# Patient Record
Sex: Male | Born: 1980 | Race: Black or African American | Hispanic: No | Marital: Single | Smoking: Current every day smoker
Health system: Southern US, Community
[De-identification: ages and names within clinical notes are randomized; demographics above are authoritative.]

## PROBLEM LIST (undated history)

## (undated) DIAGNOSIS — J189 Pneumonia, unspecified organism: Secondary | ICD-10-CM

## (undated) DIAGNOSIS — Z21 Asymptomatic human immunodeficiency virus [HIV] infection status: Secondary | ICD-10-CM

## (undated) DIAGNOSIS — G510 Bell's palsy: Secondary | ICD-10-CM

## (undated) DIAGNOSIS — I10 Essential (primary) hypertension: Secondary | ICD-10-CM

---

## 1999-01-20 ENCOUNTER — Encounter (INDEPENDENT_AMBULATORY_CARE_PROVIDER_SITE_OTHER): Payer: Self-pay | Admitting: *Deleted

## 1999-01-20 LAB — CONVERTED CEMR LAB: CD4 Count: 382 microliters

## 1999-02-01 ENCOUNTER — Encounter (INDEPENDENT_AMBULATORY_CARE_PROVIDER_SITE_OTHER): Payer: Self-pay | Admitting: *Deleted

## 2006-01-02 ENCOUNTER — Encounter (INDEPENDENT_AMBULATORY_CARE_PROVIDER_SITE_OTHER): Payer: Self-pay | Admitting: *Deleted

## 2006-01-02 LAB — CONVERTED CEMR LAB: CD4 Count: 297 microliters

## 2006-01-31 ENCOUNTER — Encounter (INDEPENDENT_AMBULATORY_CARE_PROVIDER_SITE_OTHER): Payer: Self-pay | Admitting: *Deleted

## 2006-05-20 ENCOUNTER — Encounter (INDEPENDENT_AMBULATORY_CARE_PROVIDER_SITE_OTHER): Payer: Self-pay | Admitting: *Deleted

## 2006-05-20 ENCOUNTER — Ambulatory Visit: Payer: Self-pay | Admitting: Internal Medicine

## 2006-05-20 ENCOUNTER — Encounter: Admission: RE | Admit: 2006-05-20 | Discharge: 2006-05-20 | Payer: Self-pay | Admitting: Internal Medicine

## 2006-05-20 LAB — CONVERTED CEMR LAB: HIV 1 RNA Quant: 70 copies/mL

## 2006-07-15 ENCOUNTER — Emergency Department (HOSPITAL_COMMUNITY): Admission: EM | Admit: 2006-07-15 | Discharge: 2006-07-16 | Payer: Self-pay | Admitting: Emergency Medicine

## 2006-09-10 ENCOUNTER — Encounter: Payer: Self-pay | Admitting: Internal Medicine

## 2006-10-27 ENCOUNTER — Encounter (INDEPENDENT_AMBULATORY_CARE_PROVIDER_SITE_OTHER): Payer: Self-pay | Admitting: *Deleted

## 2006-10-27 LAB — CONVERTED CEMR LAB

## 2006-11-09 ENCOUNTER — Encounter (INDEPENDENT_AMBULATORY_CARE_PROVIDER_SITE_OTHER): Payer: Self-pay | Admitting: *Deleted

## 2006-11-13 ENCOUNTER — Emergency Department (HOSPITAL_COMMUNITY): Admission: EM | Admit: 2006-11-13 | Discharge: 2006-11-13 | Payer: Self-pay | Admitting: Emergency Medicine

## 2007-02-25 ENCOUNTER — Emergency Department (HOSPITAL_COMMUNITY): Admission: EM | Admit: 2007-02-25 | Discharge: 2007-02-25 | Payer: Self-pay | Admitting: Emergency Medicine

## 2007-03-10 DIAGNOSIS — H5310 Unspecified subjective visual disturbances: Secondary | ICD-10-CM | POA: Insufficient documentation

## 2007-03-10 DIAGNOSIS — F3289 Other specified depressive episodes: Secondary | ICD-10-CM | POA: Insufficient documentation

## 2007-03-10 DIAGNOSIS — F329 Major depressive disorder, single episode, unspecified: Secondary | ICD-10-CM | POA: Insufficient documentation

## 2007-03-10 DIAGNOSIS — B20 Human immunodeficiency virus [HIV] disease: Secondary | ICD-10-CM | POA: Insufficient documentation

## 2007-04-20 ENCOUNTER — Emergency Department (HOSPITAL_COMMUNITY): Admission: EM | Admit: 2007-04-20 | Discharge: 2007-04-20 | Payer: Self-pay | Admitting: Emergency Medicine

## 2007-07-30 ENCOUNTER — Emergency Department (HOSPITAL_COMMUNITY): Admission: EM | Admit: 2007-07-30 | Discharge: 2007-07-30 | Payer: Self-pay | Admitting: Emergency Medicine

## 2007-11-11 ENCOUNTER — Encounter: Payer: Self-pay | Admitting: Internal Medicine

## 2007-11-11 ENCOUNTER — Emergency Department (HOSPITAL_COMMUNITY): Admission: EM | Admit: 2007-11-11 | Discharge: 2007-11-11 | Payer: Self-pay | Admitting: Emergency Medicine

## 2007-11-20 ENCOUNTER — Telehealth: Payer: Self-pay | Admitting: Internal Medicine

## 2008-09-27 ENCOUNTER — Emergency Department (HOSPITAL_COMMUNITY): Admission: EM | Admit: 2008-09-27 | Discharge: 2008-09-27 | Payer: Self-pay | Admitting: Emergency Medicine

## 2009-01-03 ENCOUNTER — Emergency Department (HOSPITAL_COMMUNITY): Admission: EM | Admit: 2009-01-03 | Discharge: 2009-01-03 | Payer: Self-pay | Admitting: Emergency Medicine

## 2009-05-12 ENCOUNTER — Emergency Department (HOSPITAL_COMMUNITY): Admission: EM | Admit: 2009-05-12 | Discharge: 2009-05-12 | Payer: Self-pay | Admitting: Emergency Medicine

## 2009-10-21 ENCOUNTER — Emergency Department (HOSPITAL_COMMUNITY): Admission: EM | Admit: 2009-10-21 | Discharge: 2009-10-21 | Payer: Self-pay | Admitting: Emergency Medicine

## 2009-10-24 ENCOUNTER — Encounter: Payer: Self-pay | Admitting: Internal Medicine

## 2009-10-26 ENCOUNTER — Encounter (INDEPENDENT_AMBULATORY_CARE_PROVIDER_SITE_OTHER): Payer: Self-pay | Admitting: *Deleted

## 2010-09-02 ENCOUNTER — Emergency Department (HOSPITAL_COMMUNITY)
Admission: EM | Admit: 2010-09-02 | Discharge: 2010-09-03 | Payer: Self-pay | Source: Home / Self Care | Admitting: Emergency Medicine

## 2010-09-10 ENCOUNTER — Inpatient Hospital Stay (HOSPITAL_COMMUNITY): Admission: EM | Admit: 2010-09-10 | Discharge: 2010-09-13 | Payer: Self-pay | Source: Home / Self Care

## 2010-09-17 LAB — DIFFERENTIAL
Basophils Absolute: 0 10*3/uL (ref 0.0–0.1)
Basophils Absolute: 0 10*3/uL (ref 0.0–0.1)
Basophils Absolute: 0 10*3/uL (ref 0.0–0.1)
Basophils Relative: 0 % (ref 0–1)
Basophils Relative: 0 % (ref 0–1)
Basophils Relative: 0 % (ref 0–1)
Eosinophils Absolute: 0 10*3/uL (ref 0.0–0.7)
Eosinophils Absolute: 0.1 10*3/uL (ref 0.0–0.7)
Eosinophils Absolute: 0.2 10*3/uL (ref 0.0–0.7)
Eosinophils Relative: 0 % (ref 0–5)
Eosinophils Relative: 0 % (ref 0–5)
Eosinophils Relative: 2 % (ref 0–5)
Lymphocytes Relative: 11 % — ABNORMAL LOW (ref 12–46)
Lymphocytes Relative: 15 % (ref 12–46)
Lymphocytes Relative: 23 % (ref 12–46)
Lymphs Abs: 1.6 10*3/uL (ref 0.7–4.0)
Lymphs Abs: 2.3 10*3/uL (ref 0.7–4.0)
Lymphs Abs: 2 10*3/uL (ref 0.7–4.0)
Monocytes Absolute: 0.9 10*3/uL (ref 0.1–1.0)
Monocytes Absolute: 1.4 10*3/uL — ABNORMAL HIGH (ref 0.1–1.0)
Monocytes Absolute: 0.9 10*3/uL (ref 0.1–1.0)
Monocytes Relative: 6 % (ref 3–12)
Monocytes Relative: 9 % (ref 3–12)
Monocytes Relative: 10 % (ref 3–12)
Neutro Abs: 12.7 10*3/uL — ABNORMAL HIGH (ref 1.7–7.7)
Neutro Abs: 12 10*3/uL — ABNORMAL HIGH (ref 1.7–7.7)
Neutro Abs: 5.6 10*3/uL (ref 1.7–7.7)
Neutrophils Relative %: 83 % — ABNORMAL HIGH (ref 43–77)
Neutrophils Relative %: 76 % (ref 43–77)
Neutrophils Relative %: 65 % (ref 43–77)

## 2010-09-17 LAB — BASIC METABOLIC PANEL
BUN: 10 mg/dL (ref 6–23)
CO2: 28 mEq/L (ref 19–32)
Calcium: 8.8 mg/dL (ref 8.4–10.5)
Chloride: 107 mEq/L (ref 96–112)
Creatinine, Ser: 1.37 mg/dL (ref 0.4–1.5)
GFR calc Af Amer: >60 mL/min (ref >60)
GFR calc non Af Amer: >60 mL/min (ref >60)
Glucose, Bld: 93 mg/dL (ref 70–99)
Potassium: 4.4 mEq/L (ref 3.5–5.1)
Sodium: 140 mEq/L (ref 135–145)

## 2010-09-17 LAB — COMPREHENSIVE METABOLIC PANEL
ALT: 21 U/L (ref 0–53)
AST: 23 U/L (ref 0–37)
Albumin: 4.3 g/dL (ref 3.5–5.2)
Alkaline Phosphatase: 86 U/L (ref 39–117)
BUN: 9 mg/dL (ref 6–23)
CO2: 23 mEq/L (ref 19–32)
Calcium: 9.2 mg/dL (ref 8.4–10.5)
Chloride: 100 mEq/L (ref 96–112)
Creatinine, Ser: 1.4 mg/dL (ref 0.4–1.5)
GFR calc Af Amer: >60 mL/min (ref >60)
GFR calc non Af Amer: 59 mL/min — ABNORMAL LOW (ref >60)
Glucose, Bld: 106 mg/dL — ABNORMAL HIGH (ref 70–99)
Potassium: 3.5 mEq/L (ref 3.5–5.1)
Sodium: 133 mEq/L — ABNORMAL LOW (ref 135–145)
Total Bilirubin: 1.5 mg/dL — ABNORMAL HIGH (ref 0.3–1.2)
Total Protein: 8 g/dL (ref 6.0–8.3)

## 2010-09-17 LAB — CBC
HCT: 42.8 % (ref 39.0–52.0)
HCT: 40 % (ref 39.0–52.0)
HCT: 38.2 % — ABNORMAL LOW (ref 39.0–52.0)
Hemoglobin: 15.7 g/dL (ref 13.0–17.0)
Hemoglobin: 14.4 g/dL (ref 13.0–17.0)
Hemoglobin: 14 g/dL (ref 13.0–17.0)
MCH: 30.3 pg (ref 26.0–34.0)
MCH: 30.1 pg (ref 26.0–34.0)
MCH: 30.4 pg (ref 26.0–34.0)
MCHC: 36.7 g/dL — ABNORMAL HIGH (ref 30.0–36.0)
MCHC: 36 g/dL (ref 30.0–36.0)
MCHC: 36.6 g/dL — ABNORMAL HIGH (ref 30.0–36.0)
MCV: 82.6 fL (ref 78.0–100.0)
MCV: 83.5 fL (ref 78.0–100.0)
MCV: 83 fL (ref 78.0–100.0)
Platelets: 176 10*3/uL (ref 150–400)
Platelets: 170 10*3/uL (ref 150–400)
Platelets: 188 10*3/uL (ref 150–400)
RBC: 5.18 MIL/uL (ref 4.22–5.81)
RBC: 4.79 MIL/uL (ref 4.22–5.81)
RBC: 4.6 MIL/uL (ref 4.22–5.81)
RDW: 12.1 % (ref 11.5–15.5)
RDW: 12.2 % (ref 11.5–15.5)
RDW: 12.6 % (ref 11.5–15.5)
WBC: 15.3 10*3/uL — ABNORMAL HIGH (ref 4.0–10.5)
WBC: 15.7 10*3/uL — ABNORMAL HIGH (ref 4.0–10.5)
WBC: 8.7 10*3/uL (ref 4.0–10.5)

## 2010-09-17 LAB — URINE CULTURE
Colony Count: NO GROWTH
Culture  Setup Time: 201201100248
Culture: NO GROWTH

## 2010-09-17 LAB — HIV-1 RNA QUANT-NO REFLEX-BLD
HIV 1 RNA Quant: 520 copies/mL — ABNORMAL HIGH (ref <20)
HIV-1 RNA Quant, Log: 2.72 {Log} — ABNORMAL HIGH (ref <1.30)

## 2010-09-17 LAB — CULTURE, BLOOD (ROUTINE X 2)
Culture: NO GROWTH
Culture: NO GROWTH

## 2010-09-17 LAB — URINALYSIS, ROUTINE W REFLEX MICROSCOPIC
Bilirubin Urine: NEGATIVE
Leukocytes, UA: NEGATIVE
Nitrite: NEGATIVE
Protein, ur: NEGATIVE mg/dL
Specific Gravity, Urine: >1.03 — ABNORMAL HIGH (ref 1.005–1.030)
Urine Glucose, Fasting: NEGATIVE mg/dL
Urobilinogen, UA: 0.2 mg/dL (ref 0.0–1.0)
pH: 5 (ref 5.0–8.0)

## 2010-09-17 LAB — URINE MICROSCOPIC-ADD ON

## 2010-09-17 LAB — T-HELPER CELLS (CD4) COUNT (NOT AT ARMC)
CD4 % Helper T Cell: 12 % — ABNORMAL LOW (ref 33–55)
CD4 T Cell Abs: 320 uL — ABNORMAL LOW (ref 400–2700)

## 2010-09-17 LAB — PROCALCITONIN: Procalcitonin: <0.1 ng/mL

## 2010-09-17 LAB — LACTIC ACID, PLASMA: Lactic Acid, Venous: 1.2 mmol/L (ref 0.5–2.2)

## 2010-09-17 LAB — RAPID STREP SCREEN (MED CTR MEBANE ONLY): Streptococcus, Group A Screen (Direct): NEGATIVE

## 2010-10-02 NOTE — Miscellaneous (Signed)
Summary: Allergies updated  Clinical Lists Changes  Allergies: Added new allergy or adverse reaction of PCN  per ED report 10/22/2009. Jennet Maduro RN  October 26, 2009 4:05 PM

## 2010-10-02 NOTE — Letter (Signed)
Summary: [facility] Client Referral  [facility] Client Referral   Imported By: Florinda Marker 10/30/2009 10:19:55  _____________________________________________________________________  External Attachment:    Type:   Image     Comment:   External Document

## 2010-11-12 LAB — DIFFERENTIAL
Basophils Relative: 1 % (ref 0–1)
Eosinophils Absolute: 0.2 10*3/uL (ref 0.0–0.7)
Eosinophils Relative: 5 % (ref 0–5)
Monocytes Absolute: 0.6 10*3/uL (ref 0.1–1.0)
Monocytes Relative: 15 % — ABNORMAL HIGH (ref 3–12)
Neutrophils Relative %: 37 % — ABNORMAL LOW (ref 43–77)

## 2010-11-12 LAB — CBC
HCT: 43 % (ref 39.0–52.0)
Hemoglobin: 15.6 g/dL (ref 13.0–17.0)
MCH: 30.3 pg (ref 26.0–34.0)
MCHC: 36.3 g/dL — ABNORMAL HIGH (ref 30.0–36.0)

## 2010-11-12 LAB — BASIC METABOLIC PANEL
CO2: 32 mEq/L (ref 19–32)
Calcium: 9 mg/dL (ref 8.4–10.5)
Glucose, Bld: 98 mg/dL (ref 70–99)
Sodium: 138 mEq/L (ref 135–145)

## 2010-11-21 LAB — DIFFERENTIAL
Eosinophils Absolute: 0.1 10*3/uL (ref 0.0–0.7)
Eosinophils Relative: 3 % (ref 0–5)
Lymphocytes Relative: 32 % (ref 12–46)
Lymphs Abs: 1.6 10*3/uL (ref 0.7–4.0)
Monocytes Relative: 16 % — ABNORMAL HIGH (ref 3–12)

## 2010-11-21 LAB — CBC
MCHC: 34.5 g/dL (ref 30.0–36.0)
Platelets: 183 10*3/uL (ref 150–400)
RDW: 13 % (ref 11.5–15.5)

## 2010-11-21 LAB — COMPREHENSIVE METABOLIC PANEL
ALT: 41 U/L (ref 0–53)
AST: 37 U/L (ref 0–37)
Calcium: 9.1 mg/dL (ref 8.4–10.5)
GFR calc Af Amer: >60 mL/min (ref >60)
Sodium: 135 mEq/L (ref 135–145)
Total Protein: 7.8 g/dL (ref 6.0–8.3)

## 2010-12-17 LAB — DIFFERENTIAL
Basophils Absolute: 0 10*3/uL (ref 0.0–0.1)
Basophils Relative: 0 % (ref 0–1)
Eosinophils Absolute: 0.2 10*3/uL (ref 0.0–0.7)
Eosinophils Relative: 4 % (ref 0–5)
Monocytes Absolute: 0.5 10*3/uL (ref 0.1–1.0)
Monocytes Relative: 10 % (ref 3–12)
Neutro Abs: 2.8 10*3/uL (ref 1.7–7.7)

## 2010-12-17 LAB — CBC
Hemoglobin: 15.8 g/dL (ref 13.0–17.0)
MCHC: 34.5 g/dL (ref 30.0–36.0)
MCV: 87.5 fL (ref 78.0–100.0)
RDW: 12.9 % (ref 11.5–15.5)

## 2010-12-17 LAB — POCT CARDIAC MARKERS: CKMB, poc: <1 ng/mL — ABNORMAL LOW (ref 1.0–8.0)

## 2010-12-17 LAB — BASIC METABOLIC PANEL
CO2: 28 mEq/L (ref 19–32)
Calcium: 9.9 mg/dL (ref 8.4–10.5)
Chloride: 103 mEq/L (ref 96–112)
Glucose, Bld: 121 mg/dL — ABNORMAL HIGH (ref 70–99)
Sodium: 138 mEq/L (ref 135–145)

## 2011-02-14 IMAGING — CR DG CHEST 2V
1 series · 1 of 1 positions shown · non-contrast
Comparison: 09/02/2010

CLINICAL DATA: Fever, dizziness and weakness.

CHEST - 2 VIEW

[view not recorded]
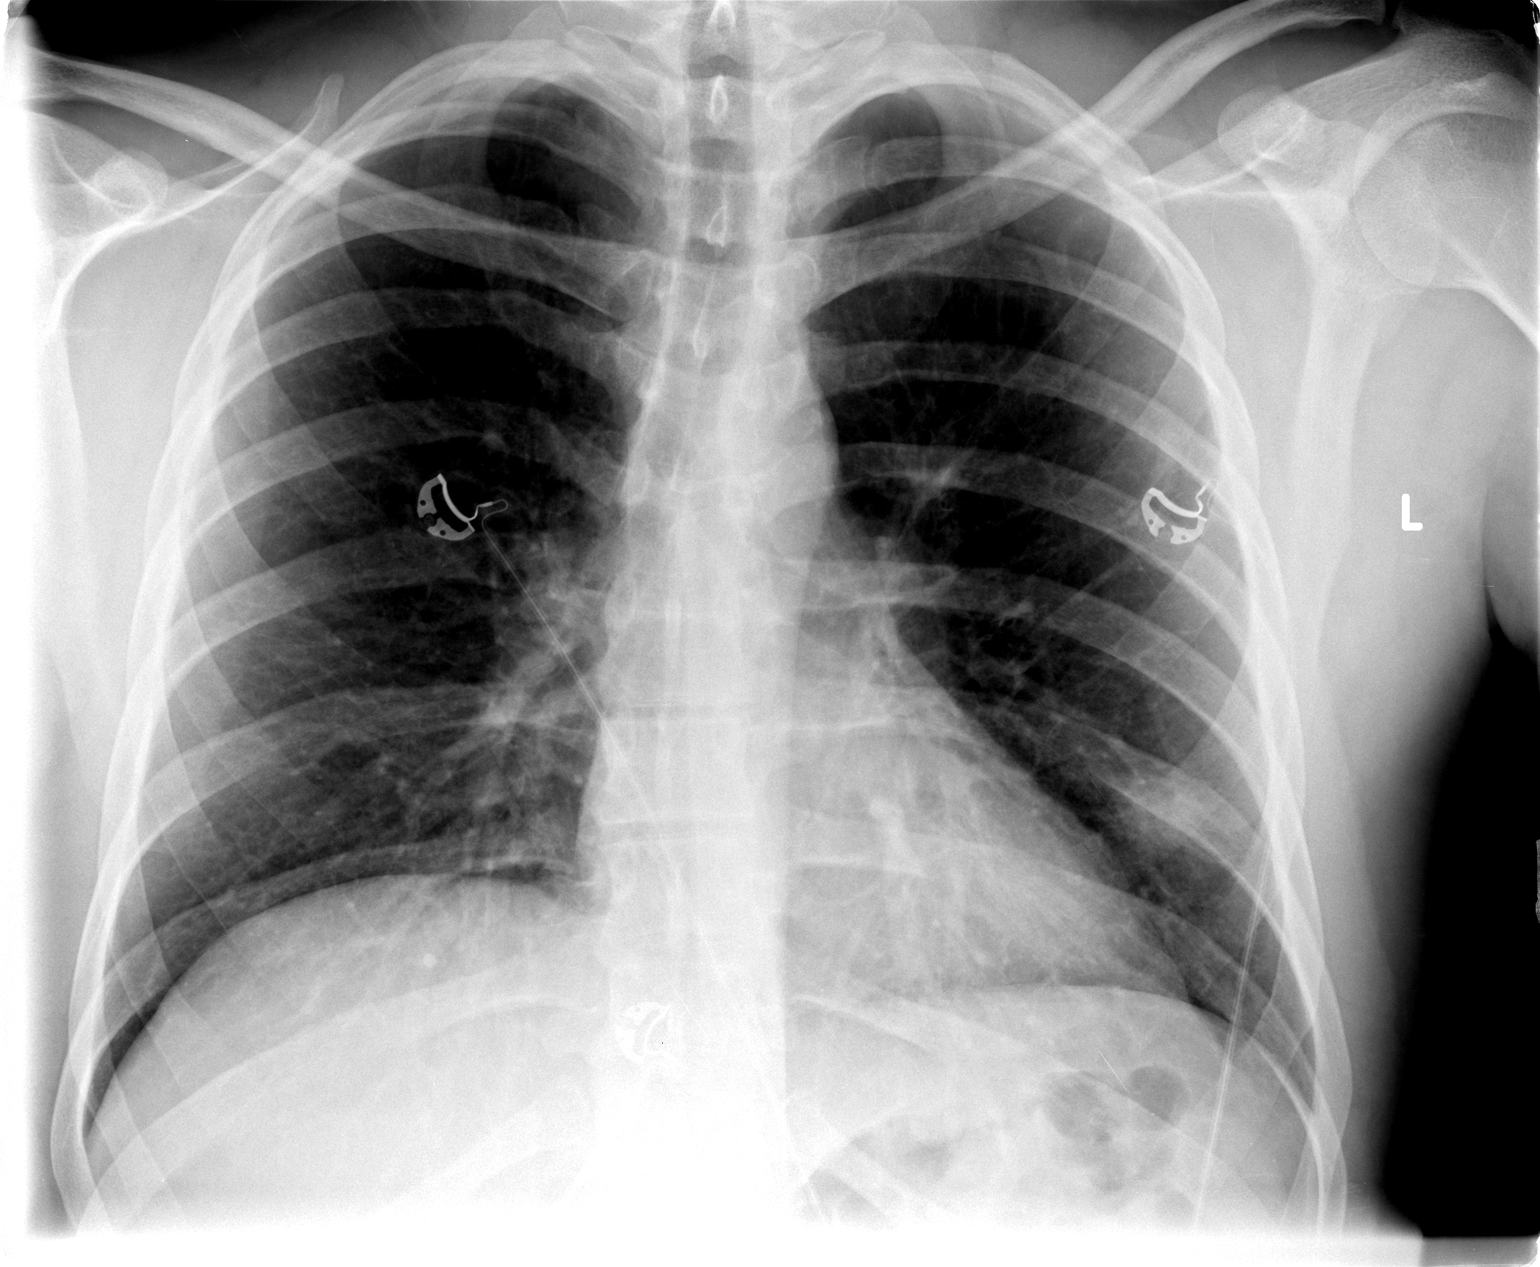

[1 of 1 positions shown; findings below may reference images not displayed]

FINDINGS: There is new focal airspace consolidation in the lingula.
Right lung is clear.  No pleural fluid.
IMPRESSION: Lingular pneumonia.

## 2011-05-23 ENCOUNTER — Emergency Department (HOSPITAL_COMMUNITY)
Admission: EM | Admit: 2011-05-23 | Discharge: 2011-05-23 | Disposition: A | Discharge status: Home / Self Care | Payer: Self-pay | Attending: Emergency Medicine | Admitting: Emergency Medicine

## 2011-05-23 ENCOUNTER — Encounter: Payer: Self-pay | Admitting: *Deleted

## 2011-05-23 DIAGNOSIS — K0889 Other specified disorders of teeth and supporting structures: Secondary | ICD-10-CM

## 2011-05-23 DIAGNOSIS — B356 Tinea cruris: Secondary | ICD-10-CM

## 2011-05-23 DIAGNOSIS — B358 Other dermatophytoses: Secondary | ICD-10-CM | POA: Insufficient documentation

## 2011-05-23 DIAGNOSIS — R21 Rash and other nonspecific skin eruption: Secondary | ICD-10-CM | POA: Insufficient documentation

## 2011-05-23 DIAGNOSIS — B20 Human immunodeficiency virus [HIV] disease: Secondary | ICD-10-CM | POA: Insufficient documentation

## 2011-05-23 DIAGNOSIS — K089 Disorder of teeth and supporting structures, unspecified: Secondary | ICD-10-CM | POA: Insufficient documentation

## 2011-05-23 HISTORY — DX: Bell's palsy: G51.0

## 2011-05-23 HISTORY — DX: Asymptomatic human immunodeficiency virus (hiv) infection status: Z21

## 2011-05-23 HISTORY — DX: Pneumonia, unspecified organism: J18.9

## 2011-05-23 MED ORDER — CLINDAMYCIN HCL 150 MG PO CAPS: 150.0000 mg | ORAL_CAPSULE | Freq: Four times a day (QID) | ORAL | Status: AC

## 2011-05-23 MED ORDER — HYDROCODONE-ACETAMINOPHEN 5-325 MG PO TABS: 1.0000 | ORAL_TABLET | Freq: Four times a day (QID) | ORAL | Status: AC | PRN

## 2011-05-23 MED ORDER — CLOTRIMAZOLE 1 % EX CREA: TOPICAL_CREAM | CUTANEOUS | Status: AC

## 2011-05-23 NOTE — ED Notes (Signed)
Pt a/ox4. Resp even and unlabored. NAD at this time. D/C instructions reviewed with pt. Pt verbalized understanding. Pt ambulated to POV with steady gate. 

## 2011-05-23 NOTE — ED Notes (Signed)
Pt c/o pain to left lower jaw. Pt states he still has his wisdom teeth on his left lower side. Pt also has an area under his chin that is swollen he would like looked at.

## 2011-05-23 NOTE — ED Provider Notes (Signed)
History   Scribed for Shelda Jakes, MD, the patient was seen in room APA15/APA15. This chart was scribed by Clarita Crane. This patient's care was started at 5:53PM.  CSN: [id number] Arrival date & time: 05/23/2011  5:45 PM  Chief Complaint  Patient presents with  . Dental Pain   HPI   HPI Kyle Gill is a 30 y.o. male who presents to the Emergency Department complaining of constant lower left sided dental pain onset several days ago and worsening since. Patient states he had several part of his left lower sided molar break off 2 weeks ago with onset of pain several days later. Denies fever, chills, nausea, vomiting. Patient also c/o a rash and area of swelling to inferior surface of chin onset 1 month ago and worsening since. Patient states swelling was mildly improved with application of Lotrimin cream but swelling persisted and worsened after finishing tube of cream. Patient with h/o HIV, pneumonia, bell's palsy. Patient is a current everyday smoker.  HPI ELEMENTS: Location: lower left side teeth  Onset: several days ago Duration: persistent since osnet  Timing: constant    Context:  as above  Associated symptoms: +rash, swelling. Denies fever, chills, nausea, vomiting.   PAST MEDICAL HISTORY:  Past Medical History  Diagnosis Date  . HIV positive   . Pneumonia   . Bell's palsy     PAST SURGICAL HISTORY:  History reviewed. No pertinent past surgical history.  FAMILY HISTORY:  History reviewed. No pertinent family history.   SOCIAL HISTORY: History   Social History  . Marital Status: Single    Spouse Name: N/A    Number of Children: N/A  . Years of Education: N/A   Social History Main Topics  . Smoking status: Current Everyday Smoker -- 0.5 packs/day  . Smokeless tobacco: None  . Alcohol Use: Yes     occasionally  . Drug Use: Yes    Special: Marijuana  . Sexually Active:    Other Topics Concern  . None   Social History Narrative  . None     Review  of Systems  Review of Systems  Constitutional: Negative for fever and chills.  HENT: Positive for dental problem. Negative for rhinorrhea and neck pain.   Eyes: Negative for pain.  Respiratory: Negative for cough and shortness of breath.   Cardiovascular: Negative for chest pain.  Gastrointestinal: Negative for nausea, vomiting, abdominal pain and diarrhea.  Genitourinary: Negative for dysuria.  Musculoskeletal: Negative for back pain.  Skin: Positive for rash.  Neurological: Negative for dizziness and weakness.    Allergies  Aspirin; Penicillins; and Sulfamethoxazole w/trimethoprim  Home Medications   Current Outpatient Rx  Name Route Sig Dispense Refill  . ACETAMINOPHEN 650 MG PO TBCR Oral Take 650 mg by mouth daily as needed. For pain     . IBUPROFEN 200 MG PO TABS Oral Take 400 mg by mouth daily as needed. For pain     . TETRAHYDROZOLINE-ZN SULFATE 0.05-0.25 % OP SOLN Both Eyes Place 2 drops into both eyes daily as needed. For red eye     . CLINDAMYCIN HCL 150 MG PO CAPS Oral Take 1 capsule (150 mg total) by mouth every 6 (six) hours. 28 capsule 0  . CLOTRIMAZOLE 1 % EX CREA  Apply to affected area 2 times daily 15 g 2  . HYDROCODONE-ACETAMINOPHEN 5-325 MG PO TABS Oral Take 1-2 tablets by mouth every 6 (six) hours as needed for pain. 14 tablet 0    Physical  Exam    BP 151/89  Pulse 77  Temp(Src) 98.6 F (37 C) (Oral)  Resp 20  Ht 6\' 3"  (1.905 m)  Wt 180 lb (81.647 kg)  BMI 22.50 kg/m2  SpO2 100%  Physical Exam  Nursing note and vitals reviewed. Constitutional: He is oriented to person, place, and time. He appears well-developed and well-nourished. No distress.  HENT:  Head: Normocephalic and atraumatic.       Scaly line to inferior surface of chin 7cm in length. Left lower molar with central aspect absent and mild swelling around remaining aspect of tooth. No abscess noted.   Eyes: EOM are normal. Pupils are equal, round, and reactive to light.  Neck: Neck supple.    Cardiovascular: Normal rate, regular rhythm and normal heart sounds.  Exam reveals no gallop and no friction rub.   No murmur heard. Pulmonary/Chest: Effort normal and breath sounds normal. He has no wheezes.  Abdominal: Soft. Bowel sounds are normal.  Musculoskeletal: Normal range of motion. He exhibits no edema.  Lymphadenopathy:    He has no cervical adenopathy.  Neurological: He is alert and oriented to person, place, and time. No sensory deficit.  Skin: Skin is warm and dry.  Psychiatric: He has a normal mood and affect. His behavior is normal.    ED Course  Procedures  OTHER DATA REVIEWED: Nursing notes, vital signs, and past medical records reviewed. Lab results reviewed and considered Imaging results reviewed and considered  DIAGNOSTIC STUDIES: Oxygen Saturation is 100% on room air, normal by my interpretation.    LABS / RADIOLOGY:   ED COURSE / COORDINATION OF CARE: 6:22PM- Patient informed of intent to d/c home with referral to free dental clinic and prescriptions to control symptoms. Patient agrees with current plan set forth.   MDM: Differential Diagnosis: LEFT LOWER MOLAR WITH CENTRAL AVULSION FRACTURES AND CHIN TINEA  INFECTION.    PLAN: Discharge Home The patient is to return the emergency department if there is any worsening of symptoms. I have reviewed the discharge instructions with the patient/family  CONDITION ON DISCHARGE: Good.   DIAGNOSIS: 1. Toothache   2. Tinea cruris      MEDICATIONS GIVEN IN THE E.D.  Medications  tetrahydrozoline-zinc (VISINE-AC) 0.05-0.25 % ophthalmic solution (not administered)  ibuprofen (ADVIL,MOTRIN) 200 MG tablet (not administered)  acetaminophen (MAPAP ARTHRITIS PAIN) 650 MG CR tablet (not administered)  clotrimazole (LOTRIMIN) 1 % cream (not administered)  clindamycin (CLEOCIN) 150 MG capsule (not administered)  HYDROcodone-acetaminophen (NORCO) 5-325 MG per tablet (not administered)      I personally  performed the services described in this documentation, which was scribed in my presence. The recorded information has been reviewed and considered. Shelda Jakes, MD     Shelda Jakes, MD 05/23/11 [phone]

## 2011-05-27 LAB — CRYPTOCOCCAL ANTIGEN: Crypto Ag: NEGATIVE

## 2011-05-27 LAB — T-HELPER CELLS (CD4) COUNT (NOT AT ARMC): CD4 % Helper T Cell: 15 — ABNORMAL LOW

## 2011-06-19 LAB — DIFFERENTIAL
Basophils Absolute: 0
Eosinophils Relative: 4
Lymphocytes Relative: 24
Lymphs Abs: 1.3
Monocytes Absolute: 0.7
Neutro Abs: 3.3

## 2011-06-19 LAB — COMPREHENSIVE METABOLIC PANEL
ALT: 22
Alkaline Phosphatase: 72
BUN: 8
CO2: 35 — ABNORMAL HIGH
Chloride: 102
Glucose, Bld: 98
Potassium: 4.4
Sodium: 139
Total Bilirubin: 0.8
Total Protein: 7

## 2011-06-19 LAB — CBC
HCT: 46.7
Hemoglobin: 16.2
RBC: 5.17
WBC: 5.5

## 2011-07-18 ENCOUNTER — Encounter (HOSPITAL_COMMUNITY): Payer: Self-pay | Admitting: *Deleted

## 2011-07-18 ENCOUNTER — Emergency Department (HOSPITAL_COMMUNITY)
Admission: EM | Admit: 2011-07-18 | Discharge: 2011-07-18 | Disposition: A | Discharge status: Home / Self Care | Payer: Self-pay | Attending: Emergency Medicine | Admitting: Emergency Medicine

## 2011-07-18 DIAGNOSIS — B86 Scabies: Secondary | ICD-10-CM | POA: Insufficient documentation

## 2011-07-18 DIAGNOSIS — L299 Pruritus, unspecified: Secondary | ICD-10-CM | POA: Insufficient documentation

## 2011-07-18 DIAGNOSIS — Z79899 Other long term (current) drug therapy: Secondary | ICD-10-CM | POA: Insufficient documentation

## 2011-07-18 DIAGNOSIS — Z21 Asymptomatic human immunodeficiency virus [HIV] infection status: Secondary | ICD-10-CM | POA: Insufficient documentation

## 2011-07-18 DIAGNOSIS — F17200 Nicotine dependence, unspecified, uncomplicated: Secondary | ICD-10-CM | POA: Insufficient documentation

## 2011-07-18 MED ORDER — PERMETHRIN 5 % EX CREA: TOPICAL_CREAM | CUTANEOUS | Status: AC

## 2011-07-18 NOTE — ED Provider Notes (Signed)
History     CSN: [id number] Arrival date & time: 07/18/2011  4:47 PM   First MD Initiated Contact with Patient 07/18/11 1640      Chief Complaint  Patient presents with  . Rash    (Consider location/radiation/quality/duration/timing/severity/associated sxs/prior treatment) Patient is a 30 y.o. male presenting with rash. The history is provided by the patient.  Rash  This is a chronic problem. Episode onset: 2 months ago; The problem has been gradually worsening. Associated with: No new contacts,  soaps,  lotions,  etc. His partner has also just started with itching,  so far has not broken out in rash. The rash is present on the torso, right upper leg, left upper leg, right arm, left arm and groin. The pain is at a severity of 0/10. The patient is experiencing no pain. Associated symptoms include itching. He has tried steriods for the symptoms. The treatment provided no relief.    Past Medical History  Diagnosis Date  . HIV positive   . Pneumonia   . Bell's palsy     History reviewed. No pertinent past surgical history.  History reviewed. No pertinent family history.  History  Substance Use Topics  . Smoking status: Current Everyday Smoker -- 0.5 packs/day    Types: Cigarettes  . Smokeless tobacco: Not on file  . Alcohol Use: Yes     occasionally      Review of Systems  Constitutional: Negative for fever.  HENT: Negative for congestion, sore throat and neck pain.   Eyes: Negative.   Respiratory: Negative for chest tightness and shortness of breath.   Cardiovascular: Negative for chest pain.  Gastrointestinal: Negative for nausea and abdominal pain.  Genitourinary: Negative.   Musculoskeletal: Negative for joint swelling and arthralgias.  Skin: Positive for itching and rash. Negative for wound.  Neurological: Negative for dizziness, weakness, light-headedness, numbness and headaches.  Hematological: Negative.   Psychiatric/Behavioral: Negative.     Allergies    Aspirin; Penicillins; and Sulfamethoxazole w/trimethoprim  Home Medications   Current Outpatient Rx  Name Route Sig Dispense Refill  . ACETAMINOPHEN ER 650 MG PO TBCR Oral Take 650 mg by mouth daily as needed. For pain     . CLOTRIMAZOLE 1 % EX CREA  Apply to affected area 2 times daily 15 g 2  . IBUPROFEN 200 MG PO TABS Oral Take 400 mg by mouth daily as needed. For pain     . PERMETHRIN 5 % EX CREA  Apply as instructed,  Leaving on for 10-12 hours,  Then shower off.  May reapply in 10 days if symptoms persist. 30 g 1  . TETRAHYDROZOLINE-ZN SULFATE 0.05-0.25 % OP SOLN Both Eyes Place 2 drops into both eyes daily as needed. For red eye       BP 148/80  Pulse 62  Temp(Src) 98.8 F (37.1 C) (Oral)  Resp 16  Ht 6' 3.5" (1.918 m)  Wt 220 lb (99.791 kg)  BMI 27.14 kg/m2  SpO2 100%  Physical Exam  Nursing note and vitals reviewed. Constitutional: He is oriented to person, place, and time. He appears well-developed and well-nourished.  HENT:  Head: Normocephalic and atraumatic.  Eyes: Conjunctivae are normal.  Neck: Normal range of motion.  Cardiovascular: Normal rate, regular rhythm, normal heart sounds and intact distal pulses.   Pulmonary/Chest: Effort normal and breath sounds normal. He has no wheezes.  Abdominal: Soft. Bowel sounds are normal. There is no tenderness.  Musculoskeletal: Normal range of motion.  Neurological: He is alert  and oriented to person, place, and time.  Skin: Skin is warm and dry. Rash noted. Rash is papular.       Excoriated scattered slightly raised papules most heavily populated at anterior waistband line,  Excoriated.  But also scattered lesions on forearms and hands,  Few in finger creases and bilateral groin.  Psychiatric: He has a normal mood and affect.    ED Course  Procedures (including critical care time)  Labs Reviewed - No data to display No results found.   1. Scabies       MDM  Patient with chronic progressive rash,  Now with  family member developing same.  No new environmental exposures.  He has recently moved from an apartment which had "filthy" carpet,  Noticed he itched more when at the apartment.  No pets,  Suspect scabies.          Candis Musa, PA 07/18/11 1818

## 2011-07-18 NOTE — ED Notes (Signed)
Patient with rash that started on his abdomen and has spread all over x 2 months, states that his room mate has them as well. Denies any new soaps or detergents, denies seeing any bugs in bedding

## 2011-07-18 NOTE — ED Notes (Signed)
MD at bedside.- J. Idol, PA in room

## 2011-07-18 NOTE — ED Notes (Signed)
Pt c/o rash all over body x 2 months and is getting worse. Pt states that it started on his stomach. C/o itching.

## 2011-07-19 NOTE — ED Provider Notes (Signed)
Medical screening examination/treatment/procedure(s) were performed by non-physician practitioner and as supervising physician I was immediately available for consultation/collaboration.  Alliah Boulanger, MD 07/19/11 0143 

## 2012-09-04 ENCOUNTER — Emergency Department (HOSPITAL_COMMUNITY)
Admission: EM | Admit: 2012-09-04 | Discharge: 2012-09-04 | Disposition: A | Discharge status: Home / Self Care | Payer: Commercial Managed Care - PPO | Attending: Emergency Medicine | Admitting: Emergency Medicine

## 2012-09-04 ENCOUNTER — Encounter (HOSPITAL_COMMUNITY): Payer: Self-pay | Admitting: Emergency Medicine

## 2012-09-04 DIAGNOSIS — R059 Cough, unspecified: Secondary | ICD-10-CM | POA: Insufficient documentation

## 2012-09-04 DIAGNOSIS — R6883 Chills (without fever): Secondary | ICD-10-CM | POA: Insufficient documentation

## 2012-09-04 DIAGNOSIS — J111 Influenza due to unidentified influenza virus with other respiratory manifestations: Secondary | ICD-10-CM | POA: Insufficient documentation

## 2012-09-04 DIAGNOSIS — R5381 Other malaise: Secondary | ICD-10-CM | POA: Insufficient documentation

## 2012-09-04 DIAGNOSIS — B349 Viral infection, unspecified: Secondary | ICD-10-CM

## 2012-09-04 DIAGNOSIS — F17200 Nicotine dependence, unspecified, uncomplicated: Secondary | ICD-10-CM | POA: Insufficient documentation

## 2012-09-04 DIAGNOSIS — J3489 Other specified disorders of nose and nasal sinuses: Secondary | ICD-10-CM | POA: Insufficient documentation

## 2012-09-04 DIAGNOSIS — B9789 Other viral agents as the cause of diseases classified elsewhere: Secondary | ICD-10-CM | POA: Insufficient documentation

## 2012-09-04 DIAGNOSIS — B20 Human immunodeficiency virus [HIV] disease: Secondary | ICD-10-CM | POA: Insufficient documentation

## 2012-09-04 DIAGNOSIS — J029 Acute pharyngitis, unspecified: Secondary | ICD-10-CM | POA: Insufficient documentation

## 2012-09-04 DIAGNOSIS — IMO0001 Reserved for inherently not codable concepts without codable children: Secondary | ICD-10-CM | POA: Insufficient documentation

## 2012-09-04 DIAGNOSIS — R05 Cough: Secondary | ICD-10-CM | POA: Insufficient documentation

## 2012-09-04 DIAGNOSIS — G510 Bell's palsy: Secondary | ICD-10-CM | POA: Insufficient documentation

## 2012-09-04 DIAGNOSIS — Z8701 Personal history of pneumonia (recurrent): Secondary | ICD-10-CM | POA: Insufficient documentation

## 2012-09-04 MED ORDER — GUAIFENESIN-CODEINE 100-10 MG/5ML PO SYRP: 10.0000 mL | ORAL_SOLUTION | Freq: Three times a day (TID) | ORAL | Status: AC | PRN

## 2012-09-04 MED ORDER — IBUPROFEN 800 MG PO TABS: 800.0000 mg | ORAL_TABLET | Freq: Three times a day (TID) | ORAL | Status: DC

## 2012-09-04 NOTE — ED Provider Notes (Signed)
History     CSN: [id number]  Arrival date & time 09/04/12  1115   First MD Initiated Contact with Patient 09/04/12 1146      Chief Complaint  Patient presents with  . Generalized Body Aches  . Influenza  . Weakness    (Consider location/radiation/quality/duration/timing/severity/associated sxs/prior treatment) Patient is a 32 y.o. male presenting with flu symptoms and weakness. The history is provided by the patient.  Influenza This is a new problem. The current episode started in the past 7 days. The problem occurs constantly. The problem has been unchanged. Associated symptoms include chills, congestion, coughing, fatigue, headaches, myalgias and a sore throat. Pertinent negatives include no abdominal pain, anorexia, arthralgias, chest pain, fever, joint swelling, nausea, neck pain, numbness, rash, swollen glands, urinary symptoms, vertigo, vomiting or weakness. Associated symptoms comments: Nasal congestion. Nothing aggravates the symptoms. Treatments tried: otc cold medication and advil. The treatment provided no relief.  Weakness The primary symptoms include headaches. Primary symptoms do not include dizziness, fever, nausea or vomiting.  The headache is not associated with weakness.  Additional symptoms do not include weakness or vertigo.    Past Medical History  Diagnosis Date  . HIV positive   . Pneumonia   . Bell's palsy     History reviewed. No pertinent past surgical history.  History reviewed. No pertinent family history.  History  Substance Use Topics  . Smoking status: Current Every Day Smoker -- 0.5 packs/day    Types: Cigarettes  . Smokeless tobacco: Not on file  . Alcohol Use: Yes     Comment: occasionally      Review of Systems  Constitutional: Positive for chills and fatigue. Negative for fever, activity change and appetite change.  HENT: Positive for congestion, sore throat and rhinorrhea. Negative for ear pain, facial swelling, mouth sores,  trouble swallowing and neck pain.   Respiratory: Positive for cough. Negative for chest tightness, shortness of breath and wheezing.   Cardiovascular: Negative for chest pain.  Gastrointestinal: Negative for nausea, vomiting, abdominal pain and anorexia.  Genitourinary: Negative for frequency and difficulty urinating.  Musculoskeletal: Positive for myalgias. Negative for joint swelling and arthralgias.  Skin: Negative for rash.  Neurological: Positive for headaches. Negative for dizziness, vertigo, speech difficulty, weakness and numbness.  Hematological: Negative for adenopathy.    Allergies  Aspirin; Penicillins; Sulfamethoxazole w-trimethoprim; and Zinc  Home Medications   Current Outpatient Rx  Name  Route  Sig  Dispense  Refill  . ACETAMINOPHEN ER 650 MG PO TBCR   Oral   Take 650 mg by mouth daily as needed. For pain          . GUAIFENESIN-CODEINE 100-10 MG/5ML PO SYRP   Oral   Take 10 mLs by mouth 3 (three) times daily as needed for cough.   120 mL   0   . IBUPROFEN 200 MG PO TABS   Oral   Take 400 mg by mouth daily as needed. For pain          . IBUPROFEN 800 MG PO TABS   Oral   Take 1 tablet (800 mg total) by mouth 3 (three) times daily.   21 tablet   0   . TETRAHYDROZOLINE-ZN SULFATE 0.05-0.25 % OP SOLN   Both Eyes   Place 2 drops into both eyes daily as needed. For red eye            BP 149/97  Pulse 89  Temp 98.4 F (36.9 C) (Oral)  Resp 20  Ht 6\' 3"  (1.905 m)  Wt 215 lb (97.523 kg)  BMI 26.87 kg/m2  SpO2 98%  Physical Exam  Nursing note and vitals reviewed. Constitutional: He is oriented to person, place, and time. He appears well-developed and well-nourished. No distress.  HENT:  Head: Normocephalic and atraumatic. No trismus in the jaw.  Right Ear: Tympanic membrane and ear canal normal.  Left Ear: Tympanic membrane and ear canal normal.  Nose: Mucosal edema and rhinorrhea present.  Mouth/Throat: Uvula is midline and mucous membranes  are normal. No uvula swelling. Posterior oropharyngeal erythema present. No oropharyngeal exudate, posterior oropharyngeal edema or tonsillar abscesses.  Neck: Normal range of motion and phonation normal. Neck supple. No spinous process tenderness and no muscular tenderness present. No Brudzinski's sign and no Kernig's sign noted.  Cardiovascular: Normal rate, regular rhythm, normal heart sounds and intact distal pulses.   No murmur heard. Pulmonary/Chest: Effort normal and breath sounds normal. No respiratory distress. He has no wheezes. He has no rales. He exhibits no tenderness.  Abdominal: Soft. He exhibits no distension. There is no tenderness.  Musculoskeletal: He exhibits no edema and no tenderness.  Lymphadenopathy:    He has no cervical adenopathy.       Right: No supraclavicular adenopathy present.       Left: No supraclavicular adenopathy present.  Neurological: He is alert and oriented to person, place, and time. He exhibits normal muscle tone. Coordination normal.  Skin: Skin is warm and dry.    ED Course  Procedures (including critical care time)  Labs Reviewed - No data to display No results found.   1. Viral illness       MDM   Patient is afebrile and well-appearing. No hypoxia tachycardia or tachypnea. No meningeal signs. Symptoms are likely related to viral illness.  Symptoms have been present greater than 2 days of Tamiflu is not indicated at this time. Patient agrees to followup with his primary care physician or to return to the ER if symptoms worsen.  Prescribed: Guaifenesin with codeine Ibuprofen 800 mg       Lucyann Romano L. Thierry Dobosz, [place] 09/04/12 1727

## 2012-09-04 NOTE — ED Notes (Signed)
Pt states symptoms began three days ago. Having bodyaches and headache, congestion.

## 2012-09-08 NOTE — ED Provider Notes (Signed)
History/physical exam/procedure(s) were performed by non-physician practitioner and as supervising physician I was immediately available for consultation/collaboration. I have reviewed all notes and am in agreement with care and plan.   Ilia Dimaano S Eudelia Hiltunen, MD 09/08/12 1112 

## 2012-11-30 ENCOUNTER — Other Ambulatory Visit (HOSPITAL_COMMUNITY): Payer: Self-pay | Admitting: Orthopedic Surgery

## 2012-11-30 DIAGNOSIS — S43431A Superior glenoid labrum lesion of right shoulder, initial encounter: Secondary | ICD-10-CM

## 2012-12-07 ENCOUNTER — Inpatient Hospital Stay (HOSPITAL_COMMUNITY): Admission: RE | Admit: 2012-12-07 | Payer: Commercial Managed Care - PPO | Source: Ambulatory Visit

## 2012-12-07 ENCOUNTER — Ambulatory Visit (HOSPITAL_COMMUNITY): Admission: RE | Admit: 2012-12-07 | Payer: Commercial Managed Care - PPO | Source: Ambulatory Visit

## 2012-12-15 ENCOUNTER — Other Ambulatory Visit (HOSPITAL_COMMUNITY): Payer: Self-pay | Admitting: Orthopedic Surgery

## 2012-12-15 DIAGNOSIS — S43431A Superior glenoid labrum lesion of right shoulder, initial encounter: Secondary | ICD-10-CM

## 2014-04-17 ENCOUNTER — Encounter (HOSPITAL_COMMUNITY): Payer: Self-pay | Admitting: Emergency Medicine

## 2014-04-17 ENCOUNTER — Emergency Department (HOSPITAL_COMMUNITY)
Admission: EM | Admit: 2014-04-17 | Discharge: 2014-04-17 | Disposition: A | Discharge status: Home / Self Care | Payer: Commercial Managed Care - PPO | Attending: Emergency Medicine | Admitting: Emergency Medicine

## 2014-04-17 DIAGNOSIS — Z791 Long term (current) use of non-steroidal anti-inflammatories (NSAID): Secondary | ICD-10-CM | POA: Insufficient documentation

## 2014-04-17 DIAGNOSIS — Z880 Allergy status to penicillin: Secondary | ICD-10-CM | POA: Insufficient documentation

## 2014-04-17 DIAGNOSIS — F17200 Nicotine dependence, unspecified, uncomplicated: Secondary | ICD-10-CM | POA: Insufficient documentation

## 2014-04-17 DIAGNOSIS — L02415 Cutaneous abscess of right lower limb: Secondary | ICD-10-CM

## 2014-04-17 DIAGNOSIS — L02419 Cutaneous abscess of limb, unspecified: Secondary | ICD-10-CM | POA: Insufficient documentation

## 2014-04-17 DIAGNOSIS — Z21 Asymptomatic human immunodeficiency virus [HIV] infection status: Secondary | ICD-10-CM | POA: Insufficient documentation

## 2014-04-17 DIAGNOSIS — L03119 Cellulitis of unspecified part of limb: Principal | ICD-10-CM

## 2014-04-17 DIAGNOSIS — Z8701 Personal history of pneumonia (recurrent): Secondary | ICD-10-CM | POA: Insufficient documentation

## 2014-04-17 DIAGNOSIS — Z8669 Personal history of other diseases of the nervous system and sense organs: Secondary | ICD-10-CM | POA: Insufficient documentation

## 2014-04-17 MED ORDER — DOXYCYCLINE HYCLATE 100 MG PO CAPS: 100.0000 mg | ORAL_CAPSULE | Freq: Two times a day (BID) | ORAL | Status: DC

## 2014-04-17 MED ORDER — TRAMADOL HCL 50 MG PO TABS: 50.0000 mg | ORAL_TABLET | Freq: Four times a day (QID) | ORAL | Status: DC | PRN

## 2014-04-17 NOTE — ED Notes (Signed)
At discharge patient verbalized dissatisfaction with pain medication he was prescribed, stated he "knew it wouldn't help".

## 2014-04-17 NOTE — ED Notes (Signed)
Pt with insect bite for a week, believes has fever but unsure, denies N/V or recent diarrhea

## 2014-04-17 NOTE — Discharge Instructions (Signed)

## 2014-04-26 NOTE — ED Provider Notes (Signed)
CSN: [id number]     Arrival date & time 04/17/14  2007 History   First MD Initiated Contact with Patient 04/17/14 2018     Chief Complaint  Patient presents with  . Insect Bite     (Consider location/radiation/quality/duration/timing/severity/associated sxs/prior Treatment) HPI  33yM with painful lesion to R posterior thigh. First noticed about a week ago. Persistent since. Thinks he may have been bitten by a spider, but didn't specifically see one. No drainage. No fever or chills. No n/v. No other acute complaints.   Past Medical History  Diagnosis Date  . HIV positive   . Pneumonia   . Bell's palsy    History reviewed. No pertinent past surgical history. History reviewed. No pertinent family history. History  Substance Use Topics  . Smoking status: Current Every Day Smoker -- 0.50 packs/day    Types: Cigarettes  . Smokeless tobacco: Not on file  . Alcohol Use: Yes     Comment: occasionally    Review of Systems  All systems reviewed and negative, other than as noted in HPI.   Allergies  Aspirin; Penicillins; and Sulfamethoxazole-trimethoprim  Home Medications   Prior to Admission medications   Medication Sig Start Date End Date Taking? Authorizing Provider  acetaminophen (MAPAP ARTHRITIS PAIN) 650 MG CR tablet Take 650 mg by mouth daily as needed. For pain     Historical Provider, MD  doxycycline (VIBRAMYCIN) 100 MG capsule Take 1 capsule (100 mg total) by mouth 2 (two) times daily. 04/17/14   Raeford Razor, MD  ibuprofen (ADVIL,MOTRIN) 200 MG tablet Take 400 mg by mouth daily as needed. For pain     Historical Provider, MD  ibuprofen (ADVIL,MOTRIN) 800 MG tablet Take 1 tablet (800 mg total) by mouth 3 (three) times daily. 09/04/12   Tammy L. Triplett, PA-C  tetrahydrozoline-zinc (VISINE-AC) 0.05-0.25 % ophthalmic solution Place 2 drops into both eyes daily as needed. For red eye     Historical Provider, MD  traMADol (ULTRAM) 50 MG tablet Take 1 tablet (50 mg total) by  mouth every 6 (six) hours as needed. 04/17/14   Raeford Razor, MD   BP 144/97  Pulse 79  Temp(Src) 97.9 F (36.6 C) (Oral)  Resp 20  Ht  (1.905 m)  Wt 220 lb (99.791 kg)  BMI 27.50 kg/m2  SpO2 100% Physical Exam  Nursing note and vitals reviewed. Constitutional: He appears well-developed and well-nourished. No distress.  HENT:  Head: Normocephalic and atraumatic.  Eyes: Conjunctivae are normal. Right eye exhibits no discharge. Left eye exhibits no discharge.  Neck: Neck supple.  Cardiovascular: Normal rate, regular rhythm and normal heart sounds.  Exam reveals no gallop and no friction rub.   No murmur heard. Pulmonary/Chest: Effort normal and breath sounds normal. No respiratory distress.  Abdominal: Soft. He exhibits no distension. There is no tenderness.  Musculoskeletal: He exhibits no edema and no tenderness.  Neurological: He is alert.  Skin: Skin is warm and dry.  Dime sized lesion to posterior R thigh. Indurated. Tender. No fluctuance. No surrounding cellulitis.   Psychiatric: He has a normal mood and affect. His behavior is normal. Thought content normal.    ED Course  Procedures (including critical care time) Labs Review Labs Reviewed - No data to display  Imaging Review No results found.   EKG Interpretation None      MDM   Final diagnoses:  Abscess of right thigh    33yM with small indurated lesion to proximal R thigh. Will cover with  abx for possible developing abscess. Nothing to drain at this point. Continued care/return precautions discussed.     Raeford Razor, MD 04/26/14 [phone]

## 2014-07-22 ENCOUNTER — Emergency Department (HOSPITAL_COMMUNITY)
Admission: EM | Admit: 2014-07-22 | Discharge: 2014-07-22 | Disposition: A | Discharge status: Home / Self Care | Payer: Commercial Managed Care - PPO | Attending: Emergency Medicine | Admitting: Emergency Medicine

## 2014-07-22 ENCOUNTER — Emergency Department (HOSPITAL_COMMUNITY): Payer: Commercial Managed Care - PPO

## 2014-07-22 ENCOUNTER — Encounter (HOSPITAL_COMMUNITY): Payer: Self-pay

## 2014-07-22 DIAGNOSIS — R05 Cough: Secondary | ICD-10-CM | POA: Insufficient documentation

## 2014-07-22 DIAGNOSIS — Z720 Tobacco use: Secondary | ICD-10-CM | POA: Insufficient documentation

## 2014-07-22 DIAGNOSIS — Z21 Asymptomatic human immunodeficiency virus [HIV] infection status: Secondary | ICD-10-CM | POA: Insufficient documentation

## 2014-07-22 DIAGNOSIS — R0789 Other chest pain: Secondary | ICD-10-CM

## 2014-07-22 DIAGNOSIS — Z880 Allergy status to penicillin: Secondary | ICD-10-CM | POA: Insufficient documentation

## 2014-07-22 DIAGNOSIS — I10 Essential (primary) hypertension: Secondary | ICD-10-CM | POA: Insufficient documentation

## 2014-07-22 DIAGNOSIS — Z792 Long term (current) use of antibiotics: Secondary | ICD-10-CM | POA: Insufficient documentation

## 2014-07-22 DIAGNOSIS — R059 Cough, unspecified: Secondary | ICD-10-CM

## 2014-07-22 DIAGNOSIS — Z79899 Other long term (current) drug therapy: Secondary | ICD-10-CM | POA: Insufficient documentation

## 2014-07-22 DIAGNOSIS — Z8701 Personal history of pneumonia (recurrent): Secondary | ICD-10-CM | POA: Insufficient documentation

## 2014-07-22 DIAGNOSIS — Z8669 Personal history of other diseases of the nervous system and sense organs: Secondary | ICD-10-CM | POA: Insufficient documentation

## 2014-07-22 HISTORY — DX: Essential (primary) hypertension: I10

## 2014-07-22 LAB — CBC
HCT: 43.5 % (ref 39.0–52.0)
Hemoglobin: 15.6 g/dL (ref 13.0–17.0)
MCH: 30.5 pg (ref 26.0–34.0)
MCHC: 35.9 g/dL (ref 30.0–36.0)
MCV: 85.1 fL (ref 78.0–100.0)
PLATELETS: 219 10*3/uL (ref 150–400)
RBC: 5.11 MIL/uL (ref 4.22–5.81)
RDW: 12.7 % (ref 11.5–15.5)
WBC: 6 10*3/uL (ref 4.0–10.5)

## 2014-07-22 LAB — BASIC METABOLIC PANEL
ANION GAP: 10 (ref 5–15)
BUN: 11 mg/dL (ref 6–23)
CALCIUM: 9.1 mg/dL (ref 8.4–10.5)
CO2: 29 meq/L (ref 19–32)
Chloride: 99 mEq/L (ref 96–112)
Creatinine, Ser: 1.2 mg/dL (ref 0.50–1.35)
GFR, EST AFRICAN AMERICAN: 89 mL/min — AB (ref >90)
GFR, EST NON AFRICAN AMERICAN: 77 mL/min — AB (ref >90)
Glucose, Bld: 101 mg/dL — ABNORMAL HIGH (ref 70–99)
Potassium: 4 mEq/L (ref 3.7–5.3)
SODIUM: 138 meq/L (ref 137–147)

## 2014-07-22 LAB — TROPONIN I

## 2014-07-22 NOTE — ED Notes (Signed)
Patient states left central chest pain that is sharp and increased with movement. Patient states chest pain started a week ago

## 2014-07-22 NOTE — ED Notes (Signed)
Patient resting in bed at this time, in a position of comfort. Patient denies pain at this time.

## 2014-07-22 NOTE — ED Provider Notes (Signed)
CSN: [id number]     Arrival date & time 07/22/14  0018 History   First MD Initiated Contact with Patient 07/22/14 0243     Chief Complaint  Patient presents with  . Chest Pain     (Consider location/radiation/quality/duration/timing/severity/associated sxs/prior Treatment) HPI  This is a 33 year old male with a history of HIV not currently on antiretroviral therapy. He is we are with about a one-week history of intermittent pain in his chest. The pain is located in his left chest below the left nipple. It is well localized and described as sharp and stabbing or feeling like a bubble about to burst. It is worse with flexion or extension of his chest. It is sometimes worse with movement of the left arm. It is not worse with palpation. The pains last a couple of seconds at a time. The pain is do not make him short of breath but when they occur he feels it is difficult to take a deep breath. He denies associated diaphoresis or nausea.  Past Medical History  Diagnosis Date  . HIV positive   . Pneumonia   . Bell's palsy   . Hypertension    History reviewed. No pertinent past surgical history. History reviewed. No pertinent family history. History  Substance Use Topics  . Smoking status: Current Every Day Smoker -- 0.50 packs/day    Types: Cigarettes  . Smokeless tobacco: Not on file  . Alcohol Use: Yes     Comment: occasionally    Review of Systems  All other systems reviewed and are negative.   Allergies  Aspirin; Penicillins; and Sulfamethoxazole-trimethoprim  Home Medications   Prior to Admission medications   Medication Sig Start Date End Date Taking? Authorizing Provider  ibuprofen (ADVIL,MOTRIN) 800 MG tablet Take 1 tablet (800 mg total) by mouth 3 (three) times daily. 09/04/12  Yes Tammy L. Triplett, PA-C  acetaminophen (MAPAP ARTHRITIS PAIN) 650 MG CR tablet Take 650 mg by mouth daily as needed. For pain     Historical Provider, MD  doxycycline (VIBRAMYCIN) 100 MG capsule  Take 1 capsule (100 mg total) by mouth 2 (two) times daily. 04/17/14   Raeford RazorStephen Kohut, MD  ibuprofen (ADVIL,MOTRIN) 200 MG tablet Take 400 mg by mouth daily as needed. For pain     Historical Provider, MD  tetrahydrozoline-zinc (VISINE-AC) 0.05-0.25 % ophthalmic solution Place 2 drops into both eyes daily as needed. For red eye     Historical Provider, MD  traMADol (ULTRAM) 50 MG tablet Take 1 tablet (50 mg total) by mouth every 6 (six) hours as needed. 04/17/14   Raeford RazorStephen Kohut, MD   BP 135/85 mmHg  Pulse 68  Resp 19  Ht 6\' 3"  (1.905 m)  Wt 210 lb (95.255 kg)  BMI 26.25 kg/m2  SpO2 97%   Physical Exam  General: Well-developed, well-nourished male in no acute distress; appearance consistent with age of record HENT: normocephalic; atraumatic Eyes: pupils equal, round and reactive to light; extraocular muscles intact Neck: supple Heart: regular rate and rhythm Lungs: clear to auscultation bilaterally Chest: Nontender; pain below left nipple on extreme flexion or extension of the chest Abdomen: soft; nondistended; nontender; no masses or hepatosplenomegaly; bowel sounds present Extremities: No deformity; full range of motion; pulses normal Neurologic: Awake, alert and oriented; motor function intact in all extremities and symmetric; no facial droop Skin: Warm and dry Psychiatric: Normal mood and affect    ED Course  Procedures (including critical care time)   EKG Interpretation   Date/Time:  Friday  July 22 2014 00:30:26 EST Ventricular Rate:  71 PR Interval:  166 QRS Duration: 93 QT Interval:  385 QTC Calculation: 418 R Axis:   75 Text Interpretation:  Sinus rhythm ST elev, probable normal early repol  pattern Rate is slower Confirmed by Read DriversMOLPUS  MD, Jonny RuizJOHN ([id number]) on 07/22/2014  12:37:00 AM      MDM   Nursing notes and vitals signs, including pulse oximetry, reviewed.  Summary of this visit's results, reviewed by myself:  Labs:  Results for orders placed or performed  during the hospital encounter of 07/22/14 (from the past 24 hour(s))  CBC     Status: None   Collection Time: 07/22/14  2:28 AM  Result Value Ref Range   WBC 6.0 4.0 - 10.5 K/uL   RBC 5.11 4.22 - 5.81 MIL/uL   Hemoglobin 15.6 13.0 - 17.0 g/dL   HCT 60.443.5 54.039.0 - 98.152.0 %   MCV 85.1 78.0 - 100.0 fL   MCH 30.5 26.0 - 34.0 pg   MCHC 35.9 30.0 - 36.0 g/dL   RDW 19.112.7 47.811.5 - 29.515.5 %   Platelets 219 150 - 400 K/uL  Basic metabolic panel     Status: Abnormal   Collection Time: 07/22/14  2:28 AM  Result Value Ref Range   Sodium 138 137 - 147 mEq/L   Potassium 4.0 3.7 - 5.3 mEq/L   Chloride 99 96 - 112 mEq/L   CO2 29 19 - 32 mEq/L   Glucose, Bld 101 (H) 70 - 99 mg/dL   BUN 11 6 - 23 mg/dL   Creatinine, Ser 6.211.20 0.50 - 1.35 mg/dL   Calcium 9.1 8.4 - 30.810.5 mg/dL   GFR calc non Af Amer 77 (L) >90 mL/min   GFR calc Af Amer 89 (L) >90 mL/min   Anion gap 10 5 - 15  Troponin I ([facility])     Status: None   Collection Time: 07/22/14  2:28 AM  Result Value Ref Range   Troponin I <0.30 <0.30 ng/mL    Imaging Studies: Dg Chest 2 View  07/22/2014   CLINICAL DATA:  Left-sided chest pain for 1 week  EXAM: CHEST  2 VIEW  COMPARISON:  09/10/2010  FINDINGS: Cardiac shadow is within normal limits. The lungs are well aerated bilaterally. No focal infiltrate or sizable effusion is seen. No bony abnormality is noted.  IMPRESSION: No active cardiopulmonary disease.   Electronically Signed   By: Alcide CleverMark  Lukens M.D.   On: 07/22/2014 01:04   Patient intends to re-seek HIV treatment once he gets his insurance back.     Hanley SeamenJohn L Donnia Poplaski, MD 07/22/14 [phone]

## 2014-07-22 NOTE — ED Notes (Signed)
Pt reports pain to near left pectoral muscle x 1 week, states pain is sharp and stabbing in nature and is worse with movement, deep breathing, and bending over.

## 2014-07-22 NOTE — Discharge Instructions (Signed)

## 2014-07-22 NOTE — ED Notes (Signed)
Pt called requesting work note for today. given

## 2015-01-02 ENCOUNTER — Institutional Professional Consult (permissible substitution): Payer: 59 | Admitting: Neurology

## 2015-01-11 ENCOUNTER — Encounter: Payer: Self-pay | Admitting: Neurology

## 2015-09-07 MED FILL — clonazePAM 0.5 MG TABS: 0.5 | 15 days supply | Qty: 30 | Fill #0

## 2016-02-28 DIAGNOSIS — E663 Overweight: Secondary | ICD-10-CM | POA: Diagnosis not present

## 2016-02-28 DIAGNOSIS — J209 Acute bronchitis, unspecified: Secondary | ICD-10-CM | POA: Diagnosis not present

## 2016-02-28 DIAGNOSIS — Z6826 Body mass index (BMI) 26.0-26.9, adult: Secondary | ICD-10-CM | POA: Diagnosis not present

## 2016-02-28 DIAGNOSIS — J029 Acute pharyngitis, unspecified: Secondary | ICD-10-CM | POA: Diagnosis not present

## 2016-02-28 DIAGNOSIS — F17200 Nicotine dependence, unspecified, uncomplicated: Secondary | ICD-10-CM | POA: Diagnosis not present

## 2016-02-28 DIAGNOSIS — Z1389 Encounter for screening for other disorder: Secondary | ICD-10-CM | POA: Diagnosis not present

## 2016-02-29 MED FILL — CHANTIX STARTING MONTH BOX: 0.5 MG X 11 | 28 days supply | Qty: 53 | Fill #0

## 2016-04-30 ENCOUNTER — Ambulatory Visit: Payer: 59

## 2016-04-30 ENCOUNTER — Ambulatory Visit (INDEPENDENT_AMBULATORY_CARE_PROVIDER_SITE_OTHER): Payer: 59

## 2016-04-30 ENCOUNTER — Ambulatory Visit (INDEPENDENT_AMBULATORY_CARE_PROVIDER_SITE_OTHER): Payer: 59 | Admitting: Orthopedic Surgery

## 2016-04-30 ENCOUNTER — Encounter: Payer: Self-pay | Admitting: Orthopedic Surgery

## 2016-04-30 VITALS — BP 139/90 | Ht 76.0 in | Wt 214.0 lb

## 2016-04-30 DIAGNOSIS — M25511 Pain in right shoulder: Secondary | ICD-10-CM | POA: Diagnosis not present

## 2016-04-30 DIAGNOSIS — M25311 Other instability, right shoulder: Secondary | ICD-10-CM | POA: Diagnosis not present

## 2016-04-30 NOTE — Patient Instructions (Signed)
MRI @ [place] 05/10/16 8:15AM

## 2016-04-30 NOTE — Progress Notes (Signed)
Chief Complaint  Patient presents with  . Shoulder Problem    PAIN AND RECURRENT DISLOCATIONS   HPI  Date of injury was 1999 Symptoms constant throbbing aching pain with radiation into the upper arm associated with catching locking stiffness giving way and recurrent dislocation No prior surgery Ibuprofen as needed basis Worse with motion  ROS   Visual disturbance anxiety seasonal allergy joint limb and muscle pain and weakness related to the shoulder  Past Medical History:  Diagnosis Date  . Bell's palsy   . HIV positive (HCC)   . Hypertension   . Pneumonia     No prior surgeries   Family history negative for COPD asthma positive for diabetes heart disease hypertension cancer stroke blood clot and alcoholism   Social History  Substance Use Topics  . Smoking status: Current Every Day Smoker    Packs/day: 0.50    Types: Cigarettes  . Smokeless tobacco: Not on file  . Alcohol use Yes     Comment: occasionally    Current Outpatient Prescriptions:  .  ibuprofen (ADVIL,MOTRIN) 200 MG tablet, Take 200 mg by mouth every 6 (six) hours as needed., Disp: , Rfl:   BP 139/90   Ht 6\' 4"  (1.93 m)   Wt 214 lb (97.1 kg)   BMI 26.05 kg/m   Physical Exam  Constitutional: He is oriented to person, place, and time. He appears well-developed and well-nourished. No distress.  Cardiovascular: Normal rate and intact distal pulses.   Neurological: He is alert and oriented to person, place, and time.  Skin: Skin is warm and dry. No rash noted. He is not diaphoretic. No erythema. No pallor.  Psychiatric: He has a normal mood and affect. His behavior is normal. Judgment and thought content normal.    Ortho Exam Left shoulder no deformity or atrophy no tenderness. Full range of motion. The patient does not have any apprehension but he does have a sulcus sign. Rotator cuff strength is normal. Skin normal. We did see a birthmark on the thoracic spine. Pulses normal lymph nodes axilla and  cervical area normal. Sensation to soft touch normal.  Right shoulder there is a slight 15 loss of external rotation. He has apprehension but no laxity which may be secondary to muscle guarding. He does have tenderness over the anterior joint line and he also has a 1+ sulcus sign. Cuff strength is normal. Skin is intact. Pulses are 2+ at the wrist lymph nodes in the axilla and cervical region are normal and he has no sensory deficit.  We do note a positive wrist flexion thumb forearm test for hyperlaxity  ASSESSMENT: My personal interpretation of the images:  Normal right shoulder x-ray without bony erosion of the glenoid    PLAN MRI with contrast, surgical stabilization pending  Follow-up 3 weeks  Fuller CanadaStanley Harrison, MD 04/30/2016 10:23 AM

## 2016-05-10 ENCOUNTER — Ambulatory Visit (HOSPITAL_COMMUNITY)
Admission: RE | Admit: 2016-05-10 | Discharge: 2016-05-10 | Disposition: A | Discharge status: Home / Self Care | Payer: 59 | Source: Ambulatory Visit | Attending: Orthopedic Surgery | Admitting: Orthopedic Surgery

## 2016-05-10 DIAGNOSIS — M25311 Other instability, right shoulder: Secondary | ICD-10-CM

## 2016-05-10 DIAGNOSIS — X58XXXA Exposure to other specified factors, initial encounter: Secondary | ICD-10-CM | POA: Insufficient documentation

## 2016-05-10 DIAGNOSIS — S43491A Other sprain of right shoulder joint, initial encounter: Secondary | ICD-10-CM | POA: Insufficient documentation

## 2016-05-10 DIAGNOSIS — M7581 Other shoulder lesions, right shoulder: Secondary | ICD-10-CM | POA: Insufficient documentation

## 2016-05-10 DIAGNOSIS — S43431A Superior glenoid labrum lesion of right shoulder, initial encounter: Secondary | ICD-10-CM | POA: Diagnosis not present

## 2016-05-10 DIAGNOSIS — S43004A Unspecified dislocation of right shoulder joint, initial encounter: Secondary | ICD-10-CM | POA: Diagnosis not present

## 2016-05-10 MED ORDER — GADOBENATE DIMEGLUMINE 529 MG/ML IV SOLN
5.0000 mL | Freq: Once | INTRAVENOUS | Status: AC | PRN
  Administered 2016-05-10: 0.05 mL via INTRA_ARTICULAR

## 2016-05-10 MED ORDER — IOPAMIDOL (ISOVUE-M 200) INJECTION 41%
20.0000 mL | Freq: Once | INTRAMUSCULAR | Status: AC
  Administered 2016-05-10: 15 mL via INTRA_ARTICULAR

## 2016-05-10 MED ORDER — LIDOCAINE HCL (PF) 1 % IJ SOLN
10.0000 mL | Freq: Once | INTRAMUSCULAR | Status: AC
  Administered 2016-05-10: 10 mL via INTRADERMAL

## 2016-05-10 MED ORDER — LIDOCAINE HCL 1 % IJ SOLN
INTRAMUSCULAR | Status: AC
  Filled 2016-05-10: qty 10

## 2016-05-10 MED ORDER — IOPAMIDOL (ISOVUE-M 200) INJECTION 41%
INTRAMUSCULAR | Status: AC
  Administered 2016-05-10: 15 mL via INTRA_ARTICULAR
  Filled 2016-05-10: qty 10

## 2016-05-16 ENCOUNTER — Telehealth: Payer: Self-pay | Admitting: Orthopedic Surgery

## 2016-05-16 NOTE — Telephone Encounter (Signed)
Patient called and stated that he had an MRI done and discussed the findings with Dr. Romeo AppleHarrison.  He said that Dr. Romeo AppleHarrison told him to cancel the appointment with him on the 19th and that we could get him set up with Dr. August Saucerean.   I saw no notation of this in the chart.  Would you speak to Dr. Romeo AppleHarrison regarding this and see if referral needs to be made?  I have canceled the appointment for Tuesday the 19th.  Thanks so much

## 2016-05-16 NOTE — Telephone Encounter (Signed)
I called [facility] and spoke with Alcario Droughtrica she will contact pt and make appt w/ Dr August Saucerean.

## 2016-05-21 ENCOUNTER — Ambulatory Visit: Payer: 59 | Admitting: Orthopedic Surgery

## 2016-05-22 DIAGNOSIS — M25511 Pain in right shoulder: Secondary | ICD-10-CM | POA: Diagnosis not present

## 2016-05-24 ENCOUNTER — Other Ambulatory Visit: Payer: Self-pay | Admitting: Orthopedic Surgery

## 2016-06-04 NOTE — Pre-Procedure Instructions (Signed)
Kyle MealyJoseph A Gill  06/04/2016      [facility] [place] [address] [address] [facility] [address] [place] [address] Phone: [phone] Fax: [phone]    Your procedure is scheduled on October 12  [facility] at 1000 A.M.  Call this number if you have problems the morning of surgery:  [phone]   Remember:  Do not eat food or drink liquids after midnight.   Take these medicines the morning of surgery with A SIP OF WATER NONE  7 days prior to surgery STOP taking any Aspirin, Aleve, Naproxen, Ibuprofen, Motrin, Advil, Goody's, BC's, all herbal medications, fish oil, and all vitamins    Do not wear jewelry.  Do not wear lotions, powders, or cologne, or deoderant.   Men may shave face and neck.  Do not bring valuables to the hospital.  [facility] is not responsible for any belongings or valuables.  Contacts, dentures or bridgework may not be worn into surgery.  Leave your suitcase in the car.  After surgery it may be brought to your room.  For patients admitted to the hospital, discharge time will be determined by your treatment team.  Patients discharged the day of surgery will not be allowed to drive home.    Special instructions:   [place]- Preparing For Surgery  Before surgery, you can play an important role. Because skin is not sterile, your skin needs to be as free of germs as possible. You can reduce the number of germs on your skin by washing with CHG (chlorahexidine gluconate) Soap before surgery.  CHG is an antiseptic cleaner which kills germs and bonds with the skin to continue killing germs even after washing.  Please do not use if you have an allergy to CHG or antibacterial soaps. If your skin becomes reddened/irritated stop using the CHG.  Do not shave (including legs and underarms) for at least 48 hours prior to first CHG shower. It is OK to shave your face.  Please follow these  instructions carefully.   1. Shower the NIGHT BEFORE SURGERY and the MORNING OF SURGERY with CHG.   2. If you chose to wash your hair, wash your hair first as usual with your normal shampoo.  3. After you shampoo, rinse your hair and body thoroughly to remove the shampoo.  4. Use CHG as you would any other liquid soap. You can apply CHG directly to the skin and wash gently with a scrungie or a clean washcloth.   5. Apply the CHG Soap to your body ONLY FROM THE NECK DOWN.  Do not use on open wounds or open sores. Avoid contact with your eyes, ears, mouth and genitals (private parts). Wash genitals (private parts) with your normal soap.  6. Wash thoroughly, paying special attention to the area where your surgery will be performed.  7. Thoroughly rinse your body with warm water from the neck down.  8. DO NOT shower/wash with your normal soap after using and rinsing off the CHG Soap.  9. Pat yourself dry with a CLEAN TOWEL.   10. Wear CLEAN PAJAMAS   11. Place CLEAN SHEETS on your bed the night of your first shower and DO NOT SLEEP WITH PETS.    Day of Surgery: Do not apply any deodorants/lotions. Please wear clean clothes to the hospital/surgery center.      Please read over the following fact sheets that you were given. Coughing and  Deep Breathing and Surgical Site Infection Prevention

## 2016-06-05 ENCOUNTER — Encounter (HOSPITAL_COMMUNITY): Payer: Self-pay

## 2016-06-05 ENCOUNTER — Encounter (HOSPITAL_COMMUNITY)
Admission: RE | Admit: 2016-06-05 | Discharge: 2016-06-05 | Disposition: A | Discharge status: Home / Self Care | Payer: 59 | Source: Ambulatory Visit | Attending: Orthopedic Surgery | Admitting: Orthopedic Surgery

## 2016-06-05 DIAGNOSIS — Z01810 Encounter for preprocedural cardiovascular examination: Secondary | ICD-10-CM | POA: Insufficient documentation

## 2016-06-05 DIAGNOSIS — Z01812 Encounter for preprocedural laboratory examination: Secondary | ICD-10-CM | POA: Diagnosis not present

## 2016-06-05 DIAGNOSIS — I10 Essential (primary) hypertension: Secondary | ICD-10-CM | POA: Diagnosis not present

## 2016-06-05 LAB — COMPREHENSIVE METABOLIC PANEL
ALT: 34 U/L (ref 17–63)
AST: 35 U/L (ref 15–41)
Albumin: 4.1 g/dL (ref 3.5–5.0)
Alkaline Phosphatase: 79 U/L (ref 38–126)
Anion gap: 8 (ref 5–15)
BILIRUBIN TOTAL: 0.8 mg/dL (ref 0.3–1.2)
BUN: 8 mg/dL (ref 6–20)
CHLORIDE: 103 mmol/L (ref 101–111)
CO2: 26 mmol/L (ref 22–32)
CREATININE: 1.07 mg/dL (ref 0.61–1.24)
Calcium: 9.7 mg/dL (ref 8.9–10.3)
Glucose, Bld: 95 mg/dL (ref 65–99)
POTASSIUM: 4.4 mmol/L (ref 3.5–5.1)
Sodium: 137 mmol/L (ref 135–145)
TOTAL PROTEIN: 7.5 g/dL (ref 6.5–8.1)

## 2016-06-05 LAB — CBC
HCT: 49.5 % (ref 39.0–52.0)
Hemoglobin: 17.1 g/dL — ABNORMAL HIGH (ref 13.0–17.0)
MCH: 30.3 pg (ref 26.0–34.0)
MCHC: 34.5 g/dL (ref 30.0–36.0)
MCV: 87.6 fL (ref 78.0–100.0)
PLATELETS: 215 10*3/uL (ref 150–400)
RBC: 5.65 MIL/uL (ref 4.22–5.81)
RDW: 13.1 % (ref 11.5–15.5)
WBC: 6.7 10*3/uL (ref 4.0–10.5)

## 2016-06-05 NOTE — Progress Notes (Signed)
PCP - Terie PurserSamantha Jackson Cardiologist - denies  Chest x-ray - not needed EKG - 06/05/16 Stress Test - denies ECHO - denies Cardiac Cath - denies  Patient is HIV positive, currently not on any medications to treat his HIV.   I spoke with him about that and he has not interest in going on medications.    Patient also stated that he does not want the MD to talk with his family about his medical information.  He stated that it was ok to say if it went great or not.      Patient denies shortness of breath, fever, cough and chest pain at PAT appointment

## 2016-06-12 NOTE — H&P (Signed)
Kyle Gill is an 35 y.o. male.   Chief Complaint: Right shoulder pain HPI: Kyle Gill is a 35 year old patient with right shoulder pain.  He has had multiple dislocations and reports significant instability with activities of daily living.  MRI scan consistent with large anterior inferior labral tear rotator cuff intact patient presents now for operative stabilization of the shoulder after explanation of risks and benefits in order to achieve a more stable shoulder  Past Medical History:  Diagnosis Date  . Bell's palsy   . HIV positive (HCC)   . Hypertension   . Pneumonia     No past surgical history on file.  No family history on file. Social History:  reports that he has been smoking Cigarettes.  He has been smoking about 0.50 packs per day. He has never used smokeless tobacco. He reports that he drinks alcohol. He reports that he uses drugs, including Marijuana.  Allergies:  Allergies  Allergen Reactions  . Aspirin Itching and Other (See Comments)    Dry mouth  . Penicillins Itching and Other (See Comments)    Inflamed skin around genitalia  . Sulfamethoxazole-Trimethoprim Itching and Other (See Comments)    Inflamed skin around genitalia    No prescriptions prior to admission.    No results found for this or any previous visit (from the past 48 hour(s)). No results found.  Review of Systems  Musculoskeletal: Positive for joint pain.  All other systems reviewed and are negative.   There were no vitals taken for this visit. Physical Exam  Constitutional: He appears well-developed.  HENT:  Head: Normocephalic.  Eyes: Pupils are equal, round, and reactive to light.  Neck: Normal range of motion.  Cardiovascular: Normal rate.   Respiratory: Effort normal.  Neurological: He is alert.  Skin: Skin is warm.  Psychiatric: He has a normal mood and affect.   examination the right shoulder demonstrates functional deltoid muscle good rotator cuff strength positive apprehension  negative posterior instability apprehension but positive anterior apprehension less than a centimeter sulcus sign no tenderness over the acromioclavicular joint full forward flexion with external rotation to about 60  Assessment/Plan Impression is right shoulder instability with labral tear plan arthroscopy with labral repair risks benefits discussed with the patient couldn't limited to infection or vessel damage failure of the repair as well as potential loss of some range of motion.  Patient is willing to sacrifice some loss of range of motion order to facilitate stability of the  shoulder patient understands the risks and benefits all questions answered  Cammy CopaEAN,Arnoldo Hildreth SCOTT, MD 06/12/2016, 9:53 PM

## 2016-06-13 ENCOUNTER — Ambulatory Visit (HOSPITAL_COMMUNITY): Payer: 59 | Admitting: Anesthesiology

## 2016-06-13 ENCOUNTER — Encounter (HOSPITAL_COMMUNITY)
Admission: RE | Disposition: A | Discharge status: Home / Self Care | Payer: Self-pay | Source: Ambulatory Visit | Attending: Orthopedic Surgery

## 2016-06-13 ENCOUNTER — Ambulatory Visit (HOSPITAL_COMMUNITY)
Admission: RE | Admit: 2016-06-13 | Discharge: 2016-06-13 | Disposition: A | Discharge status: Home / Self Care | Payer: 59 | Source: Ambulatory Visit | Attending: Orthopedic Surgery | Admitting: Orthopedic Surgery

## 2016-06-13 ENCOUNTER — Encounter (HOSPITAL_COMMUNITY): Payer: Self-pay | Admitting: *Deleted

## 2016-06-13 DIAGNOSIS — Z886 Allergy status to analgesic agent status: Secondary | ICD-10-CM | POA: Diagnosis not present

## 2016-06-13 DIAGNOSIS — F17210 Nicotine dependence, cigarettes, uncomplicated: Secondary | ICD-10-CM | POA: Diagnosis not present

## 2016-06-13 DIAGNOSIS — Z880 Allergy status to penicillin: Secondary | ICD-10-CM | POA: Diagnosis not present

## 2016-06-13 DIAGNOSIS — S43491A Other sprain of right shoulder joint, initial encounter: Secondary | ICD-10-CM | POA: Insufficient documentation

## 2016-06-13 DIAGNOSIS — M24411 Recurrent dislocation, right shoulder: Secondary | ICD-10-CM | POA: Diagnosis not present

## 2016-06-13 DIAGNOSIS — X58XXXA Exposure to other specified factors, initial encounter: Secondary | ICD-10-CM | POA: Diagnosis not present

## 2016-06-13 DIAGNOSIS — M25311 Other instability, right shoulder: Secondary | ICD-10-CM | POA: Diagnosis not present

## 2016-06-13 DIAGNOSIS — Z21 Asymptomatic human immunodeficiency virus [HIV] infection status: Secondary | ICD-10-CM | POA: Diagnosis not present

## 2016-06-13 DIAGNOSIS — Z882 Allergy status to sulfonamides status: Secondary | ICD-10-CM | POA: Insufficient documentation

## 2016-06-13 DIAGNOSIS — G8918 Other acute postprocedural pain: Secondary | ICD-10-CM | POA: Diagnosis not present

## 2016-06-13 DIAGNOSIS — F329 Major depressive disorder, single episode, unspecified: Secondary | ICD-10-CM | POA: Diagnosis not present

## 2016-06-13 DIAGNOSIS — S43431A Superior glenoid labrum lesion of right shoulder, initial encounter: Secondary | ICD-10-CM | POA: Diagnosis not present

## 2016-06-13 DIAGNOSIS — I10 Essential (primary) hypertension: Secondary | ICD-10-CM | POA: Insufficient documentation

## 2016-06-13 HISTORY — PX: SHOULDER ARTHROSCOPY WITH LABRAL REPAIR: SHX5691

## 2016-06-13 SURGERY — ARTHROSCOPY, SHOULDER, WITH GLENOID LABRUM REPAIR
Anesthesia: Regional | Site: Shoulder | Laterality: Right

## 2016-06-13 MED ORDER — EPHEDRINE SULFATE 50 MG/ML IJ SOLN: INTRAMUSCULAR | Status: DC | PRN

## 2016-06-13 MED ORDER — OXYCODONE HCL 5 MG/5ML PO SOLN: 5.0000 mg | Freq: Once | ORAL | Status: DC | PRN

## 2016-06-13 MED ORDER — CHLORHEXIDINE GLUCONATE 4 % EX LIQD: 60.0000 mL | Freq: Once | CUTANEOUS | Status: DC

## 2016-06-13 MED ORDER — SUGAMMADEX SODIUM 200 MG/2ML IV SOLN
INTRAVENOUS | Status: AC
  Filled 2016-06-13: qty 2

## 2016-06-13 MED ORDER — LIDOCAINE 2% (20 MG/ML) 5 ML SYRINGE
INTRAMUSCULAR | Status: AC
  Filled 2016-06-13: qty 5

## 2016-06-13 MED ORDER — PHENYLEPHRINE HCL 10 MG/ML IJ SOLN
INTRAVENOUS | Status: DC | PRN
  Administered 2016-06-13: 40 ug/min via INTRAVENOUS

## 2016-06-13 MED ORDER — EPINEPHRINE PF 1 MG/ML IJ SOLN
INTRAMUSCULAR | Status: DC | PRN
  Administered 2016-06-13: .1 mL

## 2016-06-13 MED ORDER — FENTANYL CITRATE (PF) 100 MCG/2ML IJ SOLN
INTRAMUSCULAR | Status: AC
  Filled 2016-06-13: qty 2

## 2016-06-13 MED ORDER — FENTANYL CITRATE (PF) 100 MCG/2ML IJ SOLN
INTRAMUSCULAR | Status: AC
  Administered 2016-06-13: 100 ug
  Filled 2016-06-13: qty 2

## 2016-06-13 MED ORDER — CLINDAMYCIN PHOSPHATE 900 MG/50ML IV SOLN
INTRAVENOUS | Status: AC
  Filled 2016-06-13: qty 50

## 2016-06-13 MED ORDER — ONDANSETRON HCL 4 MG/2ML IJ SOLN
INTRAMUSCULAR | Status: AC
  Filled 2016-06-13: qty 2

## 2016-06-13 MED ORDER — MIDAZOLAM HCL 2 MG/2ML IJ SOLN
INTRAMUSCULAR | Status: AC
  Administered 2016-06-13: 2 mg
  Filled 2016-06-13: qty 2

## 2016-06-13 MED ORDER — ONDANSETRON HCL 4 MG/2ML IJ SOLN: 4.0000 mg | Freq: Once | INTRAMUSCULAR | Status: DC | PRN

## 2016-06-13 MED ORDER — BUPIVACAINE-EPINEPHRINE (PF) 0.5% -1:200000 IJ SOLN
INTRAMUSCULAR | Status: DC | PRN
  Administered 2016-06-13: 30 mL via PERINEURAL

## 2016-06-13 MED ORDER — ROCURONIUM BROMIDE 100 MG/10ML IV SOLN
INTRAVENOUS | Status: DC | PRN
  Administered 2016-06-13: 50 mg via INTRAVENOUS

## 2016-06-13 MED ORDER — PHENYLEPHRINE HCL 10 MG/ML IJ SOLN
INTRAMUSCULAR | Status: DC | PRN
  Administered 2016-06-13 (×3): 80 ug via INTRAVENOUS

## 2016-06-13 MED ORDER — BUPIVACAINE HCL (PF) 0.25 % IJ SOLN
INTRAMUSCULAR | Status: DC | PRN
  Administered 2016-06-13: 14 mL

## 2016-06-13 MED ORDER — MEPERIDINE HCL 25 MG/ML IJ SOLN: 6.2500 mg | INTRAMUSCULAR | Status: DC | PRN

## 2016-06-13 MED ORDER — SODIUM CHLORIDE 0.9 % IJ SOLN
INTRAMUSCULAR | Status: DC | PRN
  Administered 2016-06-13: 30 mL via INTRAVENOUS

## 2016-06-13 MED ORDER — OXYCODONE-ACETAMINOPHEN 5-325 MG PO TABS: 1.0000 | ORAL_TABLET | Freq: Four times a day (QID) | ORAL | 0 refills | Status: DC | PRN

## 2016-06-13 MED ORDER — ROCURONIUM BROMIDE 10 MG/ML (PF) SYRINGE
PREFILLED_SYRINGE | INTRAVENOUS | Status: AC
  Filled 2016-06-13: qty 10

## 2016-06-13 MED ORDER — MIDAZOLAM HCL 5 MG/5ML IJ SOLN
INTRAMUSCULAR | Status: DC | PRN
  Administered 2016-06-13: 2 mg via INTRAVENOUS

## 2016-06-13 MED ORDER — PROPOFOL 10 MG/ML IV BOLUS
INTRAVENOUS | Status: DC | PRN
  Administered 2016-06-13: 200 mg via INTRAVENOUS

## 2016-06-13 MED ORDER — EPINEPHRINE PF 1 MG/ML IJ SOLN
INTRAMUSCULAR | Status: AC
  Filled 2016-06-13: qty 1

## 2016-06-13 MED ORDER — SODIUM CHLORIDE 0.9 % IR SOLN
Status: DC | PRN
  Administered 2016-06-13: 12000 mL

## 2016-06-13 MED ORDER — CLINDAMYCIN PHOSPHATE 900 MG/50ML IV SOLN
900.0000 mg | INTRAVENOUS | Status: AC
  Administered 2016-06-13: 900 mg via INTRAVENOUS

## 2016-06-13 MED ORDER — OXYCODONE HCL 5 MG PO TABS: 5.0000 mg | ORAL_TABLET | Freq: Once | ORAL | Status: DC | PRN

## 2016-06-13 MED ORDER — PROPOFOL 10 MG/ML IV BOLUS
INTRAVENOUS | Status: AC
  Filled 2016-06-13: qty 20

## 2016-06-13 MED ORDER — SUGAMMADEX SODIUM 200 MG/2ML IV SOLN
INTRAVENOUS | Status: DC | PRN
  Administered 2016-06-13: 190 mg via INTRAVENOUS

## 2016-06-13 MED ORDER — BUPIVACAINE HCL (PF) 0.25 % IJ SOLN
INTRAMUSCULAR | Status: AC
  Filled 2016-06-13: qty 30

## 2016-06-13 MED ORDER — GLYCOPYRROLATE 0.2 MG/ML IJ SOLN
INTRAMUSCULAR | Status: DC | PRN
  Administered 2016-06-13: .15 mg via INTRAVENOUS

## 2016-06-13 MED ORDER — PHENYLEPHRINE 40 MCG/ML (10ML) SYRINGE FOR IV PUSH (FOR BLOOD PRESSURE SUPPORT)
PREFILLED_SYRINGE | INTRAVENOUS | Status: AC
  Filled 2016-06-13: qty 10

## 2016-06-13 MED ORDER — METHOCARBAMOL 500 MG PO TABS: 500.0000 mg | ORAL_TABLET | Freq: Three times a day (TID) | ORAL | 0 refills | Status: DC | PRN

## 2016-06-13 MED ORDER — GLYCOPYRROLATE 0.2 MG/ML IV SOSY
PREFILLED_SYRINGE | INTRAVENOUS | Status: AC
  Filled 2016-06-13: qty 3

## 2016-06-13 MED ORDER — FENTANYL CITRATE (PF) 100 MCG/2ML IJ SOLN
INTRAMUSCULAR | Status: DC | PRN
  Administered 2016-06-13: 100 ug via INTRAVENOUS

## 2016-06-13 MED ORDER — LIDOCAINE HCL (CARDIAC) 20 MG/ML IV SOLN
INTRAVENOUS | Status: DC | PRN
  Administered 2016-06-13: 100 mg via INTRAVENOUS

## 2016-06-13 MED ORDER — LACTATED RINGERS IV SOLN
INTRAVENOUS | Status: DC
  Administered 2016-06-13 (×2): via INTRAVENOUS

## 2016-06-13 MED ORDER — HYDROMORPHONE HCL 1 MG/ML IJ SOLN: 0.2500 mg | INTRAMUSCULAR | Status: DC | PRN

## 2016-06-13 MED ORDER — ONDANSETRON HCL 4 MG/2ML IJ SOLN
INTRAMUSCULAR | Status: DC | PRN
  Administered 2016-06-13: 4 mg via INTRAVENOUS

## 2016-06-13 MED FILL — OXYCODONE/APAP 5-325: 5-325 | 5 days supply | Qty: 60 | Fill #0

## 2016-06-13 MED FILL — METHOCARBAMOL 500 MG TABLET: 500 | 10 days supply | Qty: 30 | Fill #0

## 2016-06-13 SURGICAL SUPPLY — 66 items
ANCHOR SUT BIOCOMP LK 2.9X12.5 (Anchor) ×10 IMPLANT
BLADE CUTTER GATOR 3.5 (BLADE) ×2 IMPLANT
BLADE GREAT WHITE 4.2 (BLADE) ×2 IMPLANT
BLADE SURG 11 STRL SS (BLADE) ×2 IMPLANT
BUR OVAL 6.0 (BURR) ×2 IMPLANT
CANNULA 5.75X71 LONG (CANNULA) IMPLANT
CANNULA TWIST IN 8.25X7CM (CANNULA) ×2 IMPLANT
COVER SURGICAL LIGHT HANDLE (MISCELLANEOUS) ×2 IMPLANT
DRAPE INCISE IOBAN 66X45 STRL (DRAPES) ×4 IMPLANT
DRAPE STERI 35X30 U-POUCH (DRAPES) ×4 IMPLANT
DRSG TEGADERM 2-3/8X2-3/4 SM (GAUZE/BANDAGES/DRESSINGS) ×2 IMPLANT
DRSG TEGADERM 4X4.75 (GAUZE/BANDAGES/DRESSINGS) ×2 IMPLANT
DURAPREP 26ML APPLICATOR (WOUND CARE) ×2 IMPLANT
ELECT REM PT RETURN 9FT ADLT (ELECTROSURGICAL) ×2
ELECTRODE REM PT RTRN 9FT ADLT (ELECTROSURGICAL) ×1 IMPLANT
FIBERSTICK 2 (SUTURE) IMPLANT
FILTER STRAW FLUID ASPIR (MISCELLANEOUS) ×2 IMPLANT
GAUZE SPONGE 4X4 12PLY STRL (GAUZE/BANDAGES/DRESSINGS) ×2 IMPLANT
GAUZE XEROFORM 1X8 LF (GAUZE/BANDAGES/DRESSINGS) ×2 IMPLANT
GLOVE BIOGEL PI IND STRL 8 (GLOVE) ×1 IMPLANT
GLOVE BIOGEL PI INDICATOR 8 (GLOVE) ×1
GLOVE SURG ORTHO 8.0 STRL STRW (GLOVE) ×2 IMPLANT
GOWN STRL REUS W/ TWL LRG LVL3 (GOWN DISPOSABLE) ×3 IMPLANT
GOWN STRL REUS W/TWL LRG LVL3 (GOWN DISPOSABLE) ×3
KIT BASIN OR (CUSTOM PROCEDURE TRAY) ×2 IMPLANT
KIT DISPOSABLE PUSHLOCK 2.9MM (KITS) ×2 IMPLANT
KIT ROOM TURNOVER OR (KITS) ×2 IMPLANT
LASSO 90 CVE QUICKPAS (DISPOSABLE) ×4 IMPLANT
LASSO CRESCENT QUICKPASS (SUTURE) IMPLANT
LOOP 2 FIBERLINK CLOSED (SUTURE) ×6 IMPLANT
MANIFOLD NEPTUNE II (INSTRUMENTS) ×2 IMPLANT
NDL SUT 6 .5 CRC .975X.05 MAYO (NEEDLE) ×1 IMPLANT
NEEDLE HYPO 25X1 1.5 SAFETY (NEEDLE) ×2 IMPLANT
NEEDLE MAYO TAPER (NEEDLE) ×1
NEEDLE SPNL 18GX3.5 QUINCKE PK (NEEDLE) ×2 IMPLANT
NS IRRIG 1000ML POUR BTL (IV SOLUTION) ×2 IMPLANT
PACK SHOULDER (CUSTOM PROCEDURE TRAY) ×2 IMPLANT
PAD ARMBOARD 7.5X6 YLW CONV (MISCELLANEOUS) ×4 IMPLANT
SET ARTHROSCOPY TUBING (MISCELLANEOUS) ×1
SET ARTHROSCOPY TUBING LN (MISCELLANEOUS) ×1 IMPLANT
SLING ARM IMMOBILIZER MED (SOFTGOODS) IMPLANT
SLING ARM IMMOBILIZER XL (CAST SUPPLIES) ×2 IMPLANT
SPONGE LAP 4X18 X RAY DECT (DISPOSABLE) ×4 IMPLANT
STRIP CLOSURE SKIN 1/2X4 (GAUZE/BANDAGES/DRESSINGS) ×2 IMPLANT
SUCTION FRAZIER HANDLE 10FR (MISCELLANEOUS) ×1
SUCTION TUBE FRAZIER 10FR DISP (MISCELLANEOUS) ×1 IMPLANT
SUT ETHILON 3 0 PS 1 (SUTURE) ×2 IMPLANT
SUT FIBERWIRE #2 38 REV NDL BL (SUTURE) ×2
SUT FIBERWIRE 2-0 18 17.9 3/8 (SUTURE)
SUT VIC AB 0 CT1 27 (SUTURE) ×2
SUT VIC AB 0 CT1 27XBRD ANBCTR (SUTURE) ×2 IMPLANT
SUT VIC AB 1 CT1 27 (SUTURE) ×1
SUT VIC AB 1 CT1 27XBRD ANBCTR (SUTURE) ×1 IMPLANT
SUT VIC AB 2-0 CT1 27 (SUTURE) ×1
SUT VIC AB 2-0 CT1 TAPERPNT 27 (SUTURE) ×1 IMPLANT
SUT VICRYL 0 UR6 27IN ABS (SUTURE) IMPLANT
SUTURE FIBERWR 2-0 18 17.9 3/8 (SUTURE) IMPLANT
SUTURE FIBERWR#2 38 REV NDL BL (SUTURE) ×1 IMPLANT
SYR 20CC LL (SYRINGE) ×4 IMPLANT
SYR TB 1ML LUER SLIP (SYRINGE) ×2 IMPLANT
TAPE LABRALWHITE 1.5X36 (TAPE) IMPLANT
TAPE SUT LABRALTAP WHT/BLK (SUTURE) ×4 IMPLANT
TOWEL OR 17X24 6PK STRL BLUE (TOWEL DISPOSABLE) ×2 IMPLANT
TOWEL OR 17X26 10 PK STRL BLUE (TOWEL DISPOSABLE) ×2 IMPLANT
WAND HAND CNTRL MULTIVAC 90 (MISCELLANEOUS) IMPLANT
WATER STERILE IRR 1000ML POUR (IV SOLUTION) ×2 IMPLANT

## 2016-06-13 NOTE — Brief Op Note (Signed)
06/13/2016  4:11 PM  PATIENT:  Kyle Gill  35 y.o. male  PRE-OPERATIVE DIAGNOSIS:  RIGHT SHOULDER LABRAL TEAR  POST-OPERATIVE DIAGNOSIS:  RIGHT SHOULDER LABRAL TEAR  PROCEDURE:  Procedure(s): SHOULDER DIAGNOSTIC OPERATIVE ARTHROSCOPY WITH LABRAL REPAIR  SURGEON:  Surgeon(s): Cammy CopaScott Kapri Nero, MD  ASSISTANT: April Green RNFA  ANESTHESIA:   general  EBL: 5 ml    Total I/O In: 1500 [I.V.:1500] Out: -   BLOOD ADMINISTERED: none  DRAINS: none   LOCAL MEDICATIONS USED:  marcaine  SPECIMEN:  No Specimen  COUNTS:  YES  TOURNIQUET:  * No tourniquets in log *  DICTATION: .Other Dictation: Dictation Number done  PLAN OF CARE: Discharge to home after PACU  PATIENT DISPOSITION:  PACU - hemodynamically stable

## 2016-06-13 NOTE — Anesthesia Procedure Notes (Addendum)
Anesthesia Regional Block:  Interscalene brachial plexus block  Pre-Anesthetic Checklist: ,, timeout performed, Correct Patient, Correct Site, Correct Laterality, Correct Procedure, Correct Position, site marked, Risks and benefits discussed,  Surgical consent,  Pre-op evaluation,  At surgeon's request and post-op pain management  Laterality: Right  Prep: chloraprep       Needles:   Needle Type: Echogenic Stimulator Needle     Needle Length: 9cm 9 cm Needle Gauge: 21 and 21 G  Needle insertion depth: 3 cm   Additional Needles:  Procedures: ultrasound guided (picture in chart) Interscalene brachial plexus block Narrative:  Start time: 06/13/2016 11:18 AM End time: 06/13/2016 11:26 AM Injection made incrementally with aspirations every 5 mL.  Performed by: Personally  Anesthesiologist: Mal AmabileFOSTER, Madyn Ivins  Additional Notes: Relevant anatomy ID'd using US. Incremental 5ml injection with frequent aspiration.Patient tolerated procedure well.

## 2016-06-13 NOTE — Anesthesia Postprocedure Evaluation (Signed)
Anesthesia Post Note  Patient: Kyle MealyJoseph A Gill  Procedure(s) Performed: Procedure(s) (LRB): SHOULDER DIAGNOSTIC OPERATIVE ARTHROSCOPY WITH LABRAL REPAIR (Right)  Patient location during evaluation: PACU Anesthesia Type: General Level of consciousness: awake and alert and oriented Pain management: pain level controlled Vital Signs Assessment: post-procedure vital signs reviewed and stable Respiratory status: spontaneous breathing, nonlabored ventilation and respiratory function stable Cardiovascular status: blood pressure returned to baseline and stable Postop Assessment: no signs of nausea or vomiting Anesthetic complications: no Comments: Good sensory block post op.    Last Vitals:  Vitals:   06/13/16 1640 06/13/16 1655  BP: 116/85 123/83  Pulse: 69 (!) 53  Resp: 14 15  Temp: 36.4 C     Last Pain: There were no vitals filed for this visit.               Ezekiah Massie A.

## 2016-06-13 NOTE — Anesthesia Procedure Notes (Signed)
Procedure Name: Intubation Date/Time: 06/13/2016 1:48 PM Performed by: Marni GriffonJAMES, Julie-Anne Torain B Pre-anesthesia Checklist: Emergency Drugs available, Patient identified, Suction available and Patient being monitored Patient Re-evaluated:Patient Re-evaluated prior to inductionOxygen Delivery Method: Circle system utilized Preoxygenation: Pre-oxygenation with 100% oxygen Intubation Type: IV induction Ventilation: Mask ventilation without difficulty Laryngoscope Size: Mac and 4 Grade View: Grade I Tube type: Oral Tube size: 7.5 mm Number of attempts: 1 Airway Equipment and Method: Stylet Placement Confirmation: ETT inserted through vocal cords under direct vision,  positive ETCO2 and breath sounds checked- equal and bilateral Secured at: 23 (cm at lip) cm Tube secured with: Tape Dental Injury: Teeth and Oropharynx as per pre-operative assessment

## 2016-06-13 NOTE — Transfer of Care (Signed)
Immediate Anesthesia Transfer of Care Note  Patient: Kyle Gill  Procedure(s) Performed: Procedure(s): SHOULDER DIAGNOSTIC OPERATIVE ARTHROSCOPY WITH LABRAL REPAIR (Right)  Patient Location: PACU  Anesthesia Type:General  Level of Consciousness: awake, alert , oriented and patient cooperative  Airway & Oxygen Therapy: Patient Spontanous Breathing and Patient connected to nasal cannula oxygen  Post-op Assessment: Report given to RN and Post -op Vital signs reviewed and stable  Post vital signs: Reviewed and stable  Last Vitals:  Vitals:   06/13/16 1610 06/13/16 1615  BP: 121/82   Pulse: 78 67  Resp: (!) 5 16    Last Pain: There were no vitals filed for this visit.       Complications: No apparent anesthesia complications

## 2016-06-13 NOTE — Anesthesia Preprocedure Evaluation (Addendum)
Anesthesia Evaluation  Patient identified by MRN, date of birth, ID band Patient awake    Reviewed: Allergy & Precautions, NPO status , Patient's Chart, lab work & pertinent test results  Airway Mallampati: I  TM Distance: >3 FB Neck ROM: Full    Dental  (+) Missing, Teeth Intact, Dental Advisory Given,    Pulmonary pneumonia, resolved, Current Smoker,    Pulmonary exam normal breath sounds clear to auscultation       Cardiovascular hypertension, Normal cardiovascular exam Rhythm:Regular Rate:Normal  Borderline HTN- on no Rx   Neuro/Psych PSYCHIATRIC DISORDERS Depression  Neuromuscular disease    GI/Hepatic negative GI ROS, Neg liver ROS,   Endo/Other  negative endocrine ROS  Renal/GU negative Renal ROS  negative genitourinary   Musculoskeletal negative musculoskeletal ROS (+)   Abdominal   Peds  Hematology  (+) HIV, HIV- on no medications   Anesthesia Other Findings   Reproductive/Obstetrics                            Lab Results  Component Value Date   WBC 6.7 06/05/2016   HGB 17.1 (H) 06/05/2016   HCT 49.5 06/05/2016   MCV 87.6 06/05/2016   PLT 215 06/05/2016     Chemistry      Component Value Date/Time   NA 137 06/05/2016 1337   K 4.4 06/05/2016 1337   CL 103 06/05/2016 1337   CO2 26 06/05/2016 1337   BUN 8 06/05/2016 1337   CREATININE 1.07 06/05/2016 1337      Component Value Date/Time   CALCIUM 9.7 06/05/2016 1337   ALKPHOS 79 06/05/2016 1337   AST 35 06/05/2016 1337   ALT 34 06/05/2016 1337   BILITOT 0.8 06/05/2016 1337      Anesthesia Physical Anesthesia Plan  ASA: II  Anesthesia Plan: General and Regional   Post-op Pain Management: GA combined w/ Regional for post-op pain   Induction:   Airway Management Planned: Oral ETT  Additional Equipment:   Intra-op Plan:   Post-operative Plan: Extubation in OR  Informed Consent: I have reviewed the patients  History and Physical, chart, labs and discussed the procedure including the risks, benefits and alternatives for the proposed anesthesia with the patient or authorized representative who has indicated his/her understanding and acceptance.   Dental advisory given  Plan Discussed with: CRNA, Anesthesiologist and Surgeon  Anesthesia Plan Comments:         Anesthesia Quick Evaluation

## 2016-06-14 ENCOUNTER — Encounter (HOSPITAL_COMMUNITY): Payer: Self-pay | Admitting: Orthopedic Surgery

## 2016-06-17 NOTE — Op Note (Signed)
NAMJefferson Gill:  Beissel, Davius                 ACCOUNT NO.:  [id number]  MEDICAL RECORD NO.:  [id number]  LOCATION:                                 FACILITY:  PHYSICIAN:  Burnard BuntingG. Scott Dean, M.D.    DATE OF BIRTH:  20-Jun-1981  DATE OF PROCEDURE: DATE OF DISCHARGE:                              OPERATIVE REPORT   PREOPERATIVE DIAGNOSIS:  Right shoulder instability with Bankart tear.  POSTOPERATIVE DIAGNOSIS:  Right shoulder instability with Bankart tear.  PROCEDURE PERFORMED:  Right shoulder arthroscopy with Bankart repair.  SURGEON:  Burnard BuntingG. Scott Dean, M.D.  ASSISTANT:  April Chilton SiGreen, RNFA.  INDICATIONS:  [name] is a patient with right shoulder instability presents for operative management after explanation of risks and benefits of shoulder instability.  DESCRIPTION OF PROCEDURE:  The patient was brought to operating room where general anesthetic was induced.  Preoperative antibiotics were administered.  Time-out was called.  Right shoulder was examined under anesthesia, found to have 2+ anterior stability, 1+ posterior with less than 1 cm sulcus sign.  Range of motion was full with about 60 degrees of external rotation and 15 degrees of abduction.  At this time, the patient was placed in lateral position with the left axilla and left peroneal nerve well padded.  Right arm was then prescrubbed with alcohol and Betadine, allowed to air dry, prepped with DuraPrep solution and draped in sterile manner.  Collier Flowersoban was used to used to cover the axilla. Arm was suspended in traction at about 10 degrees of forward flexion with 15-20 pounds of longitudinal traction as well as a superior adjustable non-weighted traction.  Posterior portal was created 2 cm medial and inferior to the posterolateral margin of the acromion.  Two anterior portals were created; one in the medial aspect of the rotator interval, second was at the intersection of the biceps tendon in the subscap.  This allowed a good angle for placement  of anchors.  At this time, thorough preparation of the glenoid labrum was performed by 1st mobilizing the scarred torn anterior inferior humeral ligament.  This was done with a periosteal elevator.  At this time, 4 suture anchors were placed beginning at the 5 o'clock, 4 o'clock, 3 o'clock, and 1:30 position.  Combination of cinches and tapes were utilized.  Following repair and re-tensioning of the anterior-inferior glenohumeral ligament, the shoulder was stable, bumper was restored.  The biceps anchor was also stable.  Rotator cuff was intact.  Glenohumeral surfaces had a little bit of early wear particularly on the anterior inferior glenoid side.  This was debrided with a shaver.  Thorough irrigation was then performed.  Instruments were removed.  Portals were closed using 2- 0 Vicryl and 3-0 nylon.  Waterproof dressings placed.  The patient tolerated the procedure well without immediate complications. Transferred to recovery room in stable condition.     Burnard BuntingG. Scott Dean, M.D.   ______________________________ Reece AgarG. Dorene GrebeScott Dean, M.D.    GSD/MEDQ  D:  06/13/2016  T:  06/14/2016  Job:  [id number]

## 2016-06-19 ENCOUNTER — Ambulatory Visit (INDEPENDENT_AMBULATORY_CARE_PROVIDER_SITE_OTHER): Payer: 59 | Admitting: Orthopedic Surgery

## 2016-06-19 DIAGNOSIS — M25511 Pain in right shoulder: Secondary | ICD-10-CM

## 2016-06-24 MED FILL — oxyCODONE HCL 5 MG TABS: 5 | 7 days supply | Qty: 40 | Fill #0

## 2016-06-24 MED FILL — METHOCARBAMOL 500 MG TABLET: 500 | 10 days supply | Qty: 30 | Fill #0

## 2016-07-03 ENCOUNTER — Encounter (INDEPENDENT_AMBULATORY_CARE_PROVIDER_SITE_OTHER): Payer: Self-pay | Admitting: Orthopedic Surgery

## 2016-07-03 ENCOUNTER — Ambulatory Visit (INDEPENDENT_AMBULATORY_CARE_PROVIDER_SITE_OTHER): Payer: 59 | Admitting: Orthopedic Surgery

## 2016-07-03 DIAGNOSIS — M25311 Other instability, right shoulder: Secondary | ICD-10-CM

## 2016-07-03 NOTE — Progress Notes (Signed)
   Post-Op Visit Note   Patient: Kyle Gill           Date of Birth: 10/30/1980           MRN: [id number] Visit Date: 07/03/2016 PCP: Pershing ProudJACKSON,SAMANTHA, PA-C  Patient returns s/p right shoulder scope and Bankart repair on 06/13/16.  He is doing well, occasional sorenss/pain.  Massage helps it. He is taking oxycodone and robaxin prn only.  Wearing sling. Continue OOW.  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    Assessment & Plan:  Chief Complaint:  Chief Complaint  Patient presents with  . Right Shoulder - Routine Post Op   Visit Diagnoses: No diagnosis found.  Plan: Patient is doing well following shoulder stabilization.  Taking Percocet as needed.  We will start him in some therapy for gentle range of motion exercises.  Return will DC the sling at that time.  Parameters for range of motion provided on the prescription  Follow-Up Instructions: No Follow-up on file.   Orders:  No orders of the defined types were placed in this encounter.  No orders of the defined types were placed in this encounter.    PMFS History: Patient Active Problem List   Diagnosis Date Noted  . HIV DISEASE 03/10/2007  . DEPRESSION 03/10/2007  . VISUAL CHANGES 03/10/2007   Past Medical History:  Diagnosis Date  . Bell's palsy   . HIV positive (HCC)   . Hypertension   . Pneumonia     No family history on file.  Past Surgical History:  Procedure Laterality Date  . SHOULDER ARTHROSCOPY WITH LABRAL REPAIR Right 06/13/2016   Procedure: SHOULDER DIAGNOSTIC OPERATIVE ARTHROSCOPY WITH LABRAL REPAIR;  Surgeon: Cammy CopaScott Gregory Dean, MD;  Location: [facility] OR;  Service: Orthopedics;  Laterality: Right;   Social History   Occupational History  . Not on file.   Social History Main Topics  . Smoking status: Current Every Day Smoker    Packs/day: 0.50    Types: Cigarettes  . Smokeless tobacco: Never Used  . Alcohol use Yes     Comment: occasionally  . Drug use:     Types: Marijuana     Comment: today    . Sexual activity: Yes    Birth control/ protection: None

## 2016-07-23 ENCOUNTER — Telehealth (INDEPENDENT_AMBULATORY_CARE_PROVIDER_SITE_OTHER): Payer: Self-pay | Admitting: Orthopedic Surgery

## 2016-07-23 NOTE — Telephone Encounter (Signed)
Patient called stating he received a letter from his FMLA that says they only approved him time off until 07/30/16, but Dr. August Saucerean was keeping him out of work until 08/12/16. Please give pt a call.

## 2016-07-23 NOTE — Telephone Encounter (Signed)
IC pt and LMVM to call me to let me know if it is FMLA or STD, he will call me back.

## 2016-07-23 NOTE — Telephone Encounter (Signed)
Pt called me back and advised it is the FMLA, I have amended and faxed to [employer]. Pt aware.

## 2016-07-31 ENCOUNTER — Ambulatory Visit (INDEPENDENT_AMBULATORY_CARE_PROVIDER_SITE_OTHER): Payer: 59 | Admitting: Orthopedic Surgery

## 2016-08-02 ENCOUNTER — Encounter (HOSPITAL_COMMUNITY): Payer: Self-pay | Admitting: Occupational Therapy

## 2016-08-02 ENCOUNTER — Ambulatory Visit (HOSPITAL_COMMUNITY): Payer: 59 | Attending: Orthopedic Surgery | Admitting: Occupational Therapy

## 2016-08-02 DIAGNOSIS — M25611 Stiffness of right shoulder, not elsewhere classified: Secondary | ICD-10-CM | POA: Diagnosis not present

## 2016-08-02 DIAGNOSIS — M25511 Pain in right shoulder: Secondary | ICD-10-CM | POA: Insufficient documentation

## 2016-08-02 DIAGNOSIS — R29898 Other symptoms and signs involving the musculoskeletal system: Secondary | ICD-10-CM | POA: Insufficient documentation

## 2016-08-02 NOTE — Therapy (Signed)
[name] [address] [place], [place], [address] Phone: [phone]   Fax:  [phone]  Occupational Therapy Evaluation  Patient Details  Name: Kyle Gill MRN: [id number] Date of Birth: 1981/01/28 Referring Provider: Dr. Cammy Copa  Encounter Date: 08/02/2016      OT End of Session - 08/02/16 1542    Visit Number 1   Number of Visits 12   Date for OT Re-Evaluation 09/13/16  mini reassess on 09/01/2016   Authorization Type UMR   Authorization Time Period $20 copay   OT Start Time 1506   OT Stop Time 1539   OT Time Calculation (min) 33 min   Activity Tolerance Patient tolerated treatment well   Behavior During Therapy [facility] for tasks assessed/performed      Past Medical History:  Diagnosis Date  . Bell's palsy   . HIV positive (HCC)   . Hypertension   . Pneumonia     Past Surgical History:  Procedure Laterality Date  . SHOULDER ARTHROSCOPY WITH LABRAL REPAIR Right 06/13/2016   Procedure: SHOULDER DIAGNOSTIC OPERATIVE ARTHROSCOPY WITH LABRAL REPAIR;  Surgeon: Cammy Copa, MD;  Location: [facility] OR;  Service: Orthopedics;  Laterality: Right;    There were no vitals filed for this visit.      Subjective Assessment - 08/02/16 1500    Subjective  S: I've been dealing with the dislocations for probably 15 years.    Pertinent History Pt is a 35 y/o male s/p R shoulder arthroscopy with bankart repair on 06/13/16, after years of dislocations and pain. Pt is not wearing sling, has no pain. Pt was referred to occupational therapy by Dr. Cammy Copa   Patient Stated Goals To be able to use my right arm    Currently in Pain? No/denies           [facility] OT Assessment - 08/02/16 1457      Assessment   Diagnosis Right Shoulder Arthroscopy with Bankart Repair   Referring Provider Dr. Cammy Copa   Onset Date 06/13/16   Prior Therapy None     Precautions   Precautions Shoulder   Type of Shoulder  Precautions OT CALLING TO CLARIFY: Beginning 11/11: Flexion to 90, abduction to 90, er to 25, deltoid isometrics; progress as tolerated with er to 45 degrees ONLY.      Restrictions   Weight Bearing Restrictions No     Balance Screen   Has the patient fallen in the past 6 months No   Has the patient had a decrease in activity level because of a fear of falling?  No   Is the patient reluctant to leave their home because of a fear of falling?  No     Prior Function   Level of Independence Independent   Vocation Full time employment   Vocation Requirements [employer]: lifting, pushing, pulling, reaching   Leisure Pt enjoys cooking, physcial activity-swimming, running, fishing, decorating     ADL   ADL comments Pt is unable to use RUE for daily tasks due to precautions     Written Expression   Dominant Hand Right     Cognition   Overall Cognitive Status Within Functional Limits for tasks assessed     ROM / Strength   AROM / PROM / Strength AROM;PROM;Strength     Palpation   Palpation comment Pt with mod fascial restrictions in right deltoid, trapezius, and scapularis regions     AROM  Overall AROM  Unable to assess;Due to precautions   AROM Assessment Site Shoulder   Right/Left Shoulder Right     PROM   Overall PROM Comments Assessed supine, er/IR adducted   PROM Assessment Site Shoulder   Right/Left Shoulder Right   Right Shoulder Flexion 135 Degrees   Right Shoulder ABduction 85 Degrees   Right Shoulder Internal Rotation 90 Degrees   Right Shoulder External Rotation 18 Degrees     Strength   Overall Strength Unable to assess;Due to precautions                         OT Education - 08/02/16 1528    Education provided Yes   Education Details table slides   Person(s) Educated Patient   Methods Explanation;Demonstration;Handout   Comprehension Verbalized understanding;Returned demonstration          OT Short Term Goals - 08/02/16  1546      OT SHORT TERM GOAL #1   Title Pt will be educated on HEP.    Time 3   Period Weeks   Status New     OT SHORT TERM GOAL #2   Title Pt will decrease fascial restrictions from mod to min amounts in RUE to improve mobility when performing functional reaching.    Time 3   Period Weeks   Status New     OT SHORT TERM GOAL #3   Title Pt will increase RUE A/ROM to [facility] to improve ability to reach into overhead cabinets.    Time 3   Period Weeks   Status New     OT SHORT TERM GOAL #4   Title Pt will increase RUE strength to 4-/5 to improve ability to lift lightweight objects to shoulder level.    Time 3   Period Weeks   Status New     OT SHORT TERM GOAL #5   Title Pt will decrease RUE pain to 3/10 during functional task completion.    Time 3   Period Weeks   Status New           OT Long Term Goals - 08/02/16 1549      OT LONG TERM GOAL #1   Title Pt will return to highest level of independence and functioning using RUE as dominant during daily tasks.    Time 6   Period Weeks   Status New     OT LONG TERM GOAL #2   Title Pt will decrease RUE fascial restrictions to trace amounts or less to improve mobility during work tasks.    Time 6   Period Weeks   Status New     OT LONG TERM GOAL #3   Title Pt will decrease RUE pain to 1/10 or less to improve ability to perform work and ADL tasks.    Time 6   Period Weeks   Status New     OT LONG TERM GOAL #4   Title Pt will improve A/ROM to WNL in RUE to improve ability to perform overhead tasks while working.    Time 6   Period Weeks   Status New     OT LONG TERM GOAL #5   Title Pt will improve RUE strength to 5/5 to improve ability to perform heavy work tasks with independence.    Time 6   Period Weeks   Status New               Plan -  08/02/16 1543    Clinical Impression Statement A: Pt is a [age] y/o male s/p R shoulder arthroscopy with bankart repair on 06/13/16 presenting with limited ability to  complete ADL and IADL tasks with RUE as dominant. Pt provided with table slide HEP this session; OT will contact MD to clarify protocol prior to next session.    Rehab Potential Good   OT Frequency 2x / week   OT Duration 6 weeks   OT Treatment/Interventions Self-care/ADL training;Therapeutic exercise;Patient/family education;Manual Therapy;Therapeutic activities;Cryotherapy;Electrical Stimulation;Moist Heat;Passive range of motion   Plan P: Pt will benefit from skilled OT services to decrease pain and fascial restrictions, increase range of motion and strength, improving ability to use RUE as dominant for functional task completion. Treatment plan: myofascial release, manual therapy, P/ROM, AA/ROM, A/ROM, general RUE strengthening, scapular stability and strengthening, pt education, modalities as needed   OT Home Exercise Plan 12/1: table slides   Consulted and Agree with Plan of Care Patient      Patient will benefit from skilled therapeutic intervention in order to improve the following deficits and impairments:  Decreased activity tolerance, Decreased strength, Impaired flexibility, Decreased range of motion, Pain, Increased fascial restricitons, Impaired UE functional use  Visit Diagnosis: Acute pain of right shoulder  Stiffness of right shoulder, not elsewhere classified  Other symptoms and signs involving the musculoskeletal system    Problem List Patient Active Problem List   Diagnosis Date Noted  . HIV DISEASE 03/10/2007  . DEPRESSION 03/10/2007  . VISUAL CHANGES 03/10/2007   Ezra SitesLeslie Loie Jahr, OTR/L  [phone] 08/02/2016, 3:52 PM  [facility] [address] [place], [place], [address] Phone: [phone]   Fax:  [phone]  Name: Clover MealyJoseph A Degan MRN: [id number] Date of Birth: 01/20/1981

## 2016-08-02 NOTE — Patient Instructions (Signed)
SHOULDER: Flexion On Table   Place hands on table, elbows straight. Move hips away from body. Press hands down into table. _10-15__ reps per set, __1-3_ sets per day  Abduction (Passive)   With arm out to side, resting on table, lower head toward arm, keeping trunk away from table. Repeat _10-15___ times. Do _1-3___ sessions per day.  Copyright  VHI. All rights reserved.     Internal Rotation (Assistive)   Seated with elbow bent at right angle and held against side, slide arm on table surface in an inward arc. Repeat __10-15__ times. Do _1-3___ sessions per day. Activity: Use this motion to brush crumbs off the table.  Copyright  VHI. All rights reserved.    

## 2016-08-07 ENCOUNTER — Telehealth (HOSPITAL_COMMUNITY): Payer: Self-pay | Admitting: Occupational Therapy

## 2016-08-07 ENCOUNTER — Telehealth (INDEPENDENT_AMBULATORY_CARE_PROVIDER_SITE_OTHER): Payer: Self-pay | Admitting: Orthopedic Surgery

## 2016-08-07 NOTE — Telephone Encounter (Signed)
IC and Kyle Gill had already left for the day.  I s/w Kyle Gill and she needs the protocol for this patient?  Can you advise and have someone call them Thursday with this?

## 2016-08-07 NOTE — Telephone Encounter (Signed)
Dr. Diamantina Providenceean's rep called to answer Verlon AuLeslie Troxler's OT questions. Verlon AuLeslie had gone for the day. Dr. Diamantina Providenceean's office will call back tomorrow. NF

## 2016-08-09 ENCOUNTER — Ambulatory Visit (HOSPITAL_COMMUNITY): Payer: 59

## 2016-08-09 ENCOUNTER — Telehealth (HOSPITAL_COMMUNITY): Payer: Self-pay

## 2016-08-09 NOTE — Telephone Encounter (Signed)
Called patient regarding no show. Left a message on voicemail of his next appointment time and to call if he was unable to make it.   Limmie PatriciaLaura Quinnley Colasurdo, OTR/L,CBIS  [phone]

## 2016-08-12 NOTE — Telephone Encounter (Signed)
I called and discussed with her.

## 2016-08-13 ENCOUNTER — Ambulatory Visit (HOSPITAL_COMMUNITY): Payer: 59

## 2016-08-13 ENCOUNTER — Encounter (HOSPITAL_COMMUNITY): Payer: Self-pay

## 2016-08-13 DIAGNOSIS — M25511 Pain in right shoulder: Secondary | ICD-10-CM | POA: Diagnosis not present

## 2016-08-13 DIAGNOSIS — M25611 Stiffness of right shoulder, not elsewhere classified: Secondary | ICD-10-CM

## 2016-08-13 DIAGNOSIS — R29898 Other symptoms and signs involving the musculoskeletal system: Secondary | ICD-10-CM | POA: Diagnosis not present

## 2016-08-13 NOTE — Patient Instructions (Signed)
Complete all stretches holding for 10 seconds. Complete 2 times.  Doorway Stretch  Place each hand opposite each other on the doorway. (You can change where you feel the stretch by moving arms higher or lower.) Step through with one foot and bend front knee until a stretch is felt and hold. Step through with the opposite foot on the next rep. Hold for _____ seconds. Repeat ____times.        Internal Rotation Across Back  Grab the end of a towel with your affected side, palm facing backwards. Grab the towel with your unaffected side and pull your affected hand across your back until you feel a stretch in the front of your shoulder. If you feel pain, pull just to the pain, do not pull through the pain. Hold. Return your affected arm to your side. Try to keep your hand/arm close to your body during the entire movement.            Posterior Capsule Stretch   Stand or sit, one arm across body so hand rests over opposite shoulder. Gently push on crossed elbow with other hand until stretch is felt in shoulder of crossed arm. Hold ___ seconds.  Repeat ___ times per session. Do ___ sessions per day.   Wall Flexion  Using a towel, slide your arm up the wall until a stretch is felt in your shoulder .        1) Shoulder Protraction    Begin with elbows by your side, slowly "punch" straight out in front of you.      2) Shoulder Flexion  Standing:         Begin with arms at your side with thumbs pointed up, slowly raise both arms up and forward towards overhead.      3) Horizontal abduction/adduction   Standing:           Begin with arms straight out in front of you, bring out to the side in at "T" shape. Keep arms straight entire time.      4) Internal & External Rotation    *No band* -Stand with elbows at the side and elbows bent 90 degrees. Move your forearms away from your body, then bring back inward toward the body.     5) Shoulder  Abduction  Standing:       Lying on your back begin with your arms flat on the table next to your side. Slowly move your arms out to the side so that they go overhead, in a jumping jack or snow angel movement.       Repeat all exercises 10-15 times, 1-2 times per day.

## 2016-08-13 NOTE — Therapy (Signed)
[facility] [address] [place], [place], [address] Phone: [phone]   Fax:  [phone]  Occupational Therapy Treatment  Patient Details  Name: Kyle Gill MRN: [id number] Date of Birth: 01/10/1981 Referring Provider: Dr. Cammy CopaGregory Scott Dean  Encounter Date: 08/13/2016      OT End of Session - 08/13/16 1459    Visit Number 2   Number of Visits 12   Date for OT Re-Evaluation 09/13/16  mini reassess on 09/01/2016   Authorization Type UMR   Authorization Time Period $20 copay   OT Start Time 1352   OT Stop Time 1430   OT Time Calculation (min) 38 min   Activity Tolerance Patient tolerated treatment well   Behavior During Therapy [facility] for tasks assessed/performed      Past Medical History:  Diagnosis Date  . Bell's palsy   . HIV positive (HCC)   . Hypertension   . Pneumonia     Past Surgical History:  Procedure Laterality Date  . SHOULDER ARTHROSCOPY WITH LABRAL REPAIR Right 06/13/2016   Procedure: SHOULDER DIAGNOSTIC OPERATIVE ARTHROSCOPY WITH LABRAL REPAIR;  Surgeon: Cammy CopaScott Gregory Dean, MD;  Location: [facility] OR;  Service: Orthopedics;  Laterality: Right;    There were no vitals filed for this visit.      Subjective Assessment - 08/13/16 1357    Subjective  S: I've been painting at home but I've been really careful about how high I'm going.   Currently in Pain? No/denies            [facility] - 08/13/16 1413      Assessment   Diagnosis Right Shoulder Arthroscopy with Bankart Repair     Precautions   Precautions Shoulder   Type of Shoulder Precautions Spoke with MD to clarify any precautions. patient is ok'd to progress as tolerated.                  OT Treatments/Exercises (OP) - 08/13/16 1413      Exercises   Exercises Shoulder     Shoulder Exercises: Supine   Protraction PROM;5 reps;AROM;12 reps   Horizontal ABduction PROM;5 reps;AROM;12 reps   External Rotation PROM;5 reps;AROM;12  reps   Internal Rotation PROM;5 reps;AROM;12 reps   Flexion PROM;5 reps;AROM;12 reps   ABduction PROM;5 reps;AROM;12 reps     Manual Therapy   Manual Therapy Myofascial release   Manual therapy comments manual therapy completed prior to exercises.   Myofascial Release Myofascial release and manual stretching completed to right upper trapezius and scapularis region to decrease fascial restrictions and increase joint mobility in a pain free zone.                OT Education - 08/13/16 1424    Education provided Yes   Education Details Updated HEP. Shoulder stretches and A/ROM shoulder   Person(s) Educated Patient   Methods Explanation;Demonstration;Handout;Verbal cues   Comprehension Returned demonstration;Verbalized understanding          OT Short Term Goals - 08/13/16 1424      OT SHORT TERM GOAL #1   Title Pt will be educated on HEP.    Time 3   Period Weeks   Status On-going     OT SHORT TERM GOAL #2   Title Pt will decrease fascial restrictions from mod to min amounts in RUE to improve mobility when performing functional reaching.    Time 3   Period Weeks   Status On-going  OT SHORT TERM GOAL #3   Title Pt will increase RUE A/ROM to [facility] to improve ability to reach into overhead cabinets.    Time 3   Period Weeks   Status On-going     OT SHORT TERM GOAL #4   Title Pt will increase RUE strength to 4-/5 to improve ability to lift lightweight objects to shoulder level.    Time 3   Period Weeks   Status On-going     OT SHORT TERM GOAL #5   Title Pt will decrease RUE pain to 3/10 during functional task completion.    Time 3   Period Weeks   Status On-going           OT Long Term Goals - 08/13/16 1424      OT LONG TERM GOAL #1   Title Pt will return to highest level of independence and functioning using RUE as dominant during daily tasks.    Time 6   Period Weeks   Status On-going     OT LONG TERM GOAL #2   Title Pt will decrease RUE fascial  restrictions to trace amounts or less to improve mobility during work tasks.    Time 6   Period Weeks   Status On-going     OT LONG TERM GOAL #3   Title Pt will decrease RUE pain to 1/10 or less to improve ability to perform work and ADL tasks.    Time 6   Period Weeks   Status On-going     OT LONG TERM GOAL #4   Title Pt will improve A/ROM to WNL in RUE to improve ability to perform overhead tasks while working.    Time 6   Period Weeks   Status On-going     OT LONG TERM GOAL #5   Title Pt will improve RUE strength to 5/5 to improve ability to perform heavy work tasks with independence.    Time 6   Period Weeks   Status On-going               Plan - 08/13/16 1500    Clinical Impression Statement A: Initiated myofascial release, manual stretching, A/ROM exercises and updated HEP. Pt shows limitations with shoulder flexion, abduction, and external rotation. VC for form and technique as needed to complete exercises.    Plan P: Continue with A/ROM exercises. Add X to V arms. Follow up on HEP.      Patient will benefit from skilled therapeutic intervention in order to improve the following deficits and impairments:  Decreased activity tolerance, Decreased strength, Impaired flexibility, Decreased range of motion, Pain, Increased fascial restricitons, Impaired UE functional use  Visit Diagnosis: Stiffness of right shoulder, not elsewhere classified  Other symptoms and signs involving the musculoskeletal system    Problem List Patient Active Problem List   Diagnosis Date Noted  . HIV DISEASE 03/10/2007  . DEPRESSION 03/10/2007  . VISUAL CHANGES 03/10/2007   Kyle Gill Takeisha Cianci, OTR/L,CBIS  [phone]  08/13/2016, 3:08 PM  [facility] [address] [place], [place], [address] Phone: [phone]   Fax:  [phone]  Name: Kyle Gill MRN: [id number] Date of Birth: 10/24/1980

## 2016-08-16 ENCOUNTER — Ambulatory Visit (HOSPITAL_COMMUNITY): Payer: 59 | Admitting: Occupational Therapy

## 2016-08-16 ENCOUNTER — Telehealth (HOSPITAL_COMMUNITY): Payer: Self-pay | Admitting: Occupational Therapy

## 2016-08-16 NOTE — Telephone Encounter (Signed)
Called pt regarding no show, pt could not find schedule and did not know appt time. Reminded of next appt Tues at 1pm, will provide new schedule.   Ezra SitesLeslie Mario Voong, OTR/L  [phone] 08/16/16

## 2016-08-20 ENCOUNTER — Encounter (HOSPITAL_COMMUNITY): Payer: Self-pay | Admitting: Occupational Therapy

## 2016-08-20 ENCOUNTER — Ambulatory Visit (HOSPITAL_COMMUNITY): Payer: 59 | Admitting: Occupational Therapy

## 2016-08-20 DIAGNOSIS — R29898 Other symptoms and signs involving the musculoskeletal system: Secondary | ICD-10-CM | POA: Diagnosis not present

## 2016-08-20 DIAGNOSIS — M25511 Pain in right shoulder: Secondary | ICD-10-CM

## 2016-08-20 DIAGNOSIS — M25611 Stiffness of right shoulder, not elsewhere classified: Secondary | ICD-10-CM

## 2016-08-20 NOTE — Therapy (Signed)
[place], [place], [address] Phone: [phone]   Fax:  [phone]  Occupational Therapy Treatment  Patient Details  Name: Kyle Gill MRN: [id number] Date of Birth: 04/06/1981 Referring Provider: Dr. Meredith Pel  Encounter Date: 08/20/2016      OT End of Session - 08/20/16 1424    Visit Number 3   Number of Visits 12   Date for OT Re-Evaluation 09/13/16  mini reassess on 09/01/2016   Authorization Type UMR   Authorization Time Period $20 copay   OT Start Time 1308  pt arrived late   OT Stop Time 1345   OT Time Calculation (min) 37 min   Activity Tolerance Patient tolerated treatment well   Behavior During Therapy [facility] for tasks assessed/performed      Past Medical History:  Diagnosis Date  . Bell's palsy   . HIV positive ([place])   . Hypertension   . Pneumonia     Past Surgical History:  Procedure Laterality Date  . SHOULDER ARTHROSCOPY WITH LABRAL REPAIR Right 06/13/2016   Procedure: SHOULDER DIAGNOSTIC OPERATIVE ARTHROSCOPY WITH LABRAL REPAIR;  Surgeon: Meredith Pel, MD;  Location: [place];  Service: Orthopedics;  Laterality: Right;    There were no vitals filed for this visit.      Subjective Assessment - 08/20/16 1308    Subjective  S: I've really been working on that one going out to the side. (er)   Currently in Pain? No/denies            [facility] OT Assessment - 08/20/16 1308      Assessment   Diagnosis Right Shoulder Arthroscopy with Bankart Repair     Precautions   Precautions Shoulder   Type of Shoulder Precautions Spoke with MD to clarify any precautions. patient is ok'd to progress as tolerated.                  OT Treatments/Exercises (OP) - 08/20/16 1310      Exercises   Exercises Shoulder     Shoulder Exercises: Supine   Protraction PROM;5 reps;AROM;12 reps   Horizontal ABduction PROM;5 reps;AROM;12 reps   External Rotation PROM;5  reps;AROM;12 reps   Internal Rotation PROM;5 reps;AROM;12 reps   Flexion PROM;5 reps;AROM;12 reps   ABduction PROM;5 reps;AROM;12 reps     Shoulder Exercises: Standing   Protraction AROM;12 reps   Horizontal ABduction AROM;12 reps   External Rotation AROM;12 reps   Internal Rotation AROM;12 reps   Flexion AROM;12 reps   ABduction AROM;12 reps     Shoulder Exercises: Pulleys   Flexion 1 minute   ABduction 1 minute     Shoulder Exercises: ROM/Strengthening   Proximal Shoulder Strengthening, Supine 10X each no rest breaks   Proximal Shoulder Strengthening, Seated 10X each no rest breaks     Shoulder Exercises: Stretch   External Rotation Stretch 2 reps;10 seconds     Manual Therapy   Manual Therapy Myofascial release   Manual therapy comments manual therapy completed prior to exercises.   Myofascial Release Myofascial release and manual stretching completed to right upper trapezius and scapularis region to decrease fascial restrictions and increase joint mobility in a pain free zone.                  OT Short Term Goals - 08/13/16 1424      OT SHORT TERM GOAL #1   Title Pt will be educated on HEP.  Time 3   Period Weeks   Status On-going     OT SHORT TERM GOAL #2   Title Pt will decrease fascial restrictions from mod to min amounts in RUE to improve mobility when performing functional reaching.    Time 3   Period Weeks   Status On-going     OT SHORT TERM GOAL #3   Title Pt will increase RUE A/ROM to [facility] to improve ability to reach into overhead cabinets.    Time 3   Period Weeks   Status On-going     OT SHORT TERM GOAL #4   Title Pt will increase RUE strength to 4-/5 to improve ability to lift lightweight objects to shoulder level.    Time 3   Period Weeks   Status On-going     OT SHORT TERM GOAL #5   Title Pt will decrease RUE pain to 3/10 during functional task completion.    Time 3   Period Weeks   Status On-going           OT Long Term  Goals - 08/13/16 1424      OT LONG TERM GOAL #1   Title Pt will return to highest level of independence and functioning using RUE as dominant during daily tasks.    Time 6   Period Weeks   Status On-going     OT LONG TERM GOAL #2   Title Pt will decrease RUE fascial restrictions to trace amounts or less to improve mobility during work tasks.    Time 6   Period Weeks   Status On-going     OT LONG TERM GOAL #3   Title Pt will decrease RUE pain to 1/10 or less to improve ability to perform work and ADL tasks.    Time 6   Period Weeks   Status On-going     OT LONG TERM GOAL #4   Title Pt will improve A/ROM to WNL in RUE to improve ability to perform overhead tasks while working.    Time 6   Period Weeks   Status On-going     OT LONG TERM GOAL #5   Title Pt will improve RUE strength to 5/5 to improve ability to perform heavy work tasks with independence.    Time 6   Period Weeks   Status On-going               Plan - 08/20/16 1424    Clinical Impression Statement A: Continued with A/ROM, adding A/ROM in standing, added pulleys for end range stretch, added er stretch. Pt with ROM WFL in supine and standing, verbal cuing for correct form and technique. Pt reports HEP is going well, is focusing on improving er stretch at home.    Plan P: continue with A/ROM and er stretch, add x to v arms and prot/ret/elev/dep   OT Home Exercise Plan 12/1: table slides      Patient will benefit from skilled therapeutic intervention in order to improve the following deficits and impairments:  Decreased activity tolerance, Decreased strength, Impaired flexibility, Decreased range of motion, Pain, Increased fascial restricitons, Impaired UE functional use  Visit Diagnosis: Stiffness of right shoulder, not elsewhere classified  Other symptoms and signs involving the musculoskeletal system  Acute pain of right shoulder    Problem List Patient Active Problem List   Diagnosis Date Noted   . HIV DISEASE 03/10/2007  . DEPRESSION 03/10/2007  . VISUAL CHANGES 03/10/2007   Guadelupe Sabin, OTR/L  [phone] 08/20/2016, 2:26 PM  [place] [address] [place], [place], [address] Phone: [phone]   Fax:  [phone]  Name: JACEN CARLINI MRN: [id number] Date of Birth: 02/13/1981

## 2016-08-23 ENCOUNTER — Telehealth (INDEPENDENT_AMBULATORY_CARE_PROVIDER_SITE_OTHER): Payer: Self-pay | Admitting: Orthopedic Surgery

## 2016-08-23 ENCOUNTER — Ambulatory Visit (HOSPITAL_COMMUNITY): Payer: 59

## 2016-08-23 ENCOUNTER — Telehealth (HOSPITAL_COMMUNITY): Payer: Self-pay

## 2016-08-23 NOTE — Telephone Encounter (Signed)
Called patient regarding no show. Patient reports that he thought his schedule said 1PM and apologized for the mistake. Patient was informed that due to his no shows will only be allowed to schedule one appointment at a time. Patient verbalized understanding.   Limmie PatriciaLaura Dolorez Jeffrey, OTR/L,CBIS  [phone]

## 2016-08-23 NOTE — Telephone Encounter (Signed)
Patient called asked for a call back confirming that his disability papers were received from [employer]. The number to contact him is [phone]

## 2016-08-23 NOTE — Telephone Encounter (Signed)
IC pt and advised we have not received.  I gave him the new fax number [phone].

## 2016-08-28 ENCOUNTER — Ambulatory Visit (HOSPITAL_COMMUNITY): Payer: 59 | Admitting: Occupational Therapy

## 2016-08-30 ENCOUNTER — Ambulatory Visit (HOSPITAL_COMMUNITY): Payer: 59 | Admitting: Occupational Therapy

## 2016-09-03 ENCOUNTER — Ambulatory Visit (HOSPITAL_COMMUNITY): Payer: 59 | Admitting: Occupational Therapy

## 2016-09-03 ENCOUNTER — Telehealth (INDEPENDENT_AMBULATORY_CARE_PROVIDER_SITE_OTHER): Payer: Self-pay | Admitting: *Deleted

## 2016-09-03 NOTE — Telephone Encounter (Signed)
Pt called to see if papers were received. Pt requesting call back

## 2016-09-03 NOTE — Telephone Encounter (Signed)
IC pt and advised that we faxed it on 08/28/16, he has called, they have not received it.  We have now refaxed it to them again today.

## 2016-09-04 ENCOUNTER — Ambulatory Visit (HOSPITAL_COMMUNITY): Payer: 59 | Attending: Orthopedic Surgery | Admitting: Occupational Therapy

## 2016-09-04 ENCOUNTER — Encounter (HOSPITAL_COMMUNITY): Payer: Self-pay | Admitting: Occupational Therapy

## 2016-09-04 DIAGNOSIS — R29898 Other symptoms and signs involving the musculoskeletal system: Secondary | ICD-10-CM | POA: Insufficient documentation

## 2016-09-04 DIAGNOSIS — M25611 Stiffness of right shoulder, not elsewhere classified: Secondary | ICD-10-CM | POA: Diagnosis not present

## 2016-09-04 DIAGNOSIS — M25511 Pain in right shoulder: Secondary | ICD-10-CM | POA: Diagnosis not present

## 2016-09-04 NOTE — Therapy (Signed)
[facility] [address] [place] , [place], [address] Phone: [phone]   Fax:  [phone]  Occupational Therapy Treatment  Patient Details  Name: Kyle Gill MRN: [id number] Date of Birth: 04/27/1981 Referring Provider: Dr. Cammy CopaGregory Scott Dean  Encounter Date: 09/04/2016      OT End of Session - 09/04/16 1431    Visit Number 4   Number of Visits 12   Date for OT Re-Evaluation 09/13/16   Authorization Type UMR   Authorization Time Period $20 copay   OT Start Time 1301   OT Stop Time 1344   OT Time Calculation (min) 43 min   Activity Tolerance Patient tolerated treatment well   Behavior During Therapy [facility] for tasks assessed/performed      Past Medical History:  Diagnosis Date  . Bell's palsy   . HIV positive ([facility])   . Hypertension   . Pneumonia     Past Surgical History:  Procedure Laterality Date  . SHOULDER ARTHROSCOPY WITH LABRAL REPAIR Right 06/13/2016   Procedure: SHOULDER DIAGNOSTIC OPERATIVE ARTHROSCOPY WITH LABRAL REPAIR;  Surgeon: Cammy CopaScott Gregory Dean, MD;  [facility];  Service: Orthopedics;  Laterality: Right;    There were no vitals filed for this visit.      Subjective Assessment - 09/04/16 1258    Subjective  S: I feel like I'm using it more every day.    Currently in Pain? No/denies            [facility] Assessment - 09/04/16 1258      Assessment   Diagnosis Right Shoulder Arthroscopy with Bankart Repair     Precautions   Precautions Shoulder   Type of Shoulder Precautions Spoke with MD to clarify any precautions. patient is ok'd to progress as tolerated.                  OT Treatments/Exercises (OP) - 09/04/16 1302      Exercises   Exercises Shoulder     Shoulder Exercises: Supine   Protraction PROM;5 reps;AROM;15 reps   Horizontal ABduction PROM;5 reps;AROM;15 reps   External Rotation PROM;5 reps;AROM;15 reps   Internal Rotation PROM;5 reps;AROM;15 reps   Flexion PROM;5  reps;AROM;15 reps   ABduction PROM;5 reps;AROM;15 reps     Shoulder Exercises: Standing   Protraction AROM;15 reps   Horizontal ABduction AROM;15 reps   External Rotation AROM;15 reps   Internal Rotation AROM;15 reps   Flexion AROM;15 reps   ABduction AROM;15 reps   Extension Theraband;10 reps   Theraband Level (Shoulder Extension) Level 2 (Red)   Row Theraband;10 reps   Theraband Level (Shoulder Row) Level 2 (Red)   Retraction Theraband;10 reps   Theraband Level (Shoulder Retraction) Level 2 (Red)     Shoulder Exercises: ROM/Strengthening   UBE (Upper Arm Bike) Level 1 2' forward 2' reverse   Over Head Lace 1'   X to V Arms 10X   Proximal Shoulder Strengthening, Supine 15X each no rest breaks     Manual Therapy   Manual Therapy Myofascial release   Manual therapy comments manual therapy completed prior to exercises.   Myofascial Release Myofascial release and manual stretching completed to right upper trapezius and scapularis region to decrease fascial restrictions and increase joint mobility in a pain free zone.                OT Education - 09/04/16 1431    Education provided Yes   Education Details updated HEP for red  scapular theraband exercises   Person(s) Educated Patient   Methods Explanation;Demonstration;Handout   Comprehension Verbalized understanding;Returned demonstration          OT Short Term Goals - 08/13/16 1424      OT SHORT TERM GOAL #1   Title Pt will be educated on HEP.    Time 3   Period Weeks   Status On-going     OT SHORT TERM GOAL #2   Title Pt will decrease fascial restrictions from mod to min amounts in RUE to improve mobility when performing functional reaching.    Time 3   Period Weeks   Status On-going     OT SHORT TERM GOAL #3   Title Pt will increase RUE A/ROM to [facility] to improve ability to reach into overhead cabinets.    Time 3   Period Weeks   Status On-going     OT SHORT TERM GOAL #4   Title Pt will increase RUE  strength to 4-/5 to improve ability to lift lightweight objects to shoulder level.    Time 3   Period Weeks   Status On-going     OT SHORT TERM GOAL #5   Title Pt will decrease RUE pain to 3/10 during functional task completion.    Time 3   Period Weeks   Status On-going           OT Long Term Goals - 08/13/16 1424      OT LONG TERM GOAL #1   Title Pt will return to highest level of independence and functioning using RUE as dominant during daily tasks.    Time 6   Period Weeks   Status On-going     OT LONG TERM GOAL #2   Title Pt will decrease RUE fascial restrictions to trace amounts or less to improve mobility during work tasks.    Time 6   Period Weeks   Status On-going     OT LONG TERM GOAL #3   Title Pt will decrease RUE pain to 1/10 or less to improve ability to perform work and ADL tasks.    Time 6   Period Weeks   Status On-going     OT LONG TERM GOAL #4   Title Pt will improve A/ROM to WNL in RUE to improve ability to perform overhead tasks while working.    Time 6   Period Weeks   Status On-going     OT LONG TERM GOAL #5   Title Pt will improve RUE strength to 5/5 to improve ability to perform heavy work tasks with independence.    Time 6   Period Weeks   Status On-going               Plan - 09/04/16 1431    Clinical Impression Statement A: Continued with A/ROM, added scapular theraband exercises as well as x to v arms and UBE. Pt with ROM WFL supine and standing. Mini-reassessment not completed as pt has only completed 3 visits thus far. Pt with moderate fatigue during session, rest breaks provided as needed, no pain reported. Pt requires verbal cuing for correct form during exercises.    Plan P: Follow up on scapular theraband, add w arms, continue with er stretch   OT Home Exercise Plan 12/1: table slides; 1/3 scapular theraband   Consulted and Agree with Plan of Care Patient      Patient will benefit from skilled therapeutic intervention  in order to improve the following deficits  and impairments:  Decreased activity tolerance, Decreased strength, Impaired flexibility, Decreased range of motion, Pain, Increased fascial restricitons, Impaired UE functional use  Visit Diagnosis: Stiffness of right shoulder, not elsewhere classified  Other symptoms and signs involving the musculoskeletal system  Acute pain of right shoulder    Problem List Patient Active Problem List   Diagnosis Date Noted  . HIV DISEASE 03/10/2007  . DEPRESSION 03/10/2007  . VISUAL CHANGES 03/10/2007   Ezra Sites, OTR/L  [phone] 09/04/2016, 2:35 PM  [facility] [facility] [address] [place], [place], [address] Phone: [phone]   Fax:  [phone]  Name: Kyle Gill MRN: [id number] Date of Birth: December 26, 1980

## 2016-09-04 NOTE — Patient Instructions (Signed)
Scapular Theraband Exercises: Complete 1-2x/day  (Home) Extension: Isometric / Bilateral Arm Retraction - Sitting   Facing anchor, hold hands and elbow at shoulder height, with elbow bent.  Pull arms back to squeeze shoulder blades together. Repeat 10-15 times.  Copyright  VHI. All rights reserved.   (Home) Retraction: Row - Bilateral (Anchor)   Facing anchor, arms reaching forward, pull hands toward stomach, keeping elbows bent and at your sides and pinching shoulder blades together. Repeat 10-15 times.  Copyright  VHI. All rights reserved.   (Clinic) Extension / Flexion (Assist)   Face anchor, pull arms back, keeping elbow straight, and squeze shoulder blades together. Repeat 10-15 times.   Copyright  VHI. All rights reserved.

## 2016-09-05 ENCOUNTER — Encounter (HOSPITAL_COMMUNITY): Payer: 59 | Admitting: Occupational Therapy

## 2016-09-10 ENCOUNTER — Ambulatory Visit (HOSPITAL_COMMUNITY): Payer: 59 | Admitting: Occupational Therapy

## 2016-09-11 ENCOUNTER — Encounter (HOSPITAL_COMMUNITY): Payer: Self-pay | Admitting: Occupational Therapy

## 2016-09-11 ENCOUNTER — Ambulatory Visit (HOSPITAL_COMMUNITY): Payer: 59 | Admitting: Occupational Therapy

## 2016-09-11 DIAGNOSIS — M25611 Stiffness of right shoulder, not elsewhere classified: Secondary | ICD-10-CM

## 2016-09-11 DIAGNOSIS — M25511 Pain in right shoulder: Secondary | ICD-10-CM | POA: Diagnosis not present

## 2016-09-11 DIAGNOSIS — R29898 Other symptoms and signs involving the musculoskeletal system: Secondary | ICD-10-CM | POA: Diagnosis not present

## 2016-09-11 NOTE — Therapy (Signed)
[place], [place], [address] Phone: [phone]   Fax:  [phone]  Occupational Therapy Reassessment, Treatment, Discharge  Patient Details  Name: Kyle Gill MRN: [id number] Date of Birth: [birth year] Referring Provider: Dr. Meredith Pel  Encounter Date: 09/11/2016      OT End of Session - 09/11/16 1517    Visit Number 5   Number of Visits 12   Date for OT Re-Evaluation 09/13/16   Authorization Type UMR   Authorization Time Period $20 copay   OT Start Time 2952  pt arrived late   OT Stop Time 1515   OT Time Calculation (min) 38 min   Activity Tolerance Patient tolerated treatment well   Behavior During Therapy [facility] for tasks assessed/performed      Past Medical History:  Diagnosis Date  . Bell's palsy   . HIV positive ([place])   . Hypertension   . Pneumonia     Past Surgical History:  Procedure Laterality Date  . SHOULDER ARTHROSCOPY WITH LABRAL REPAIR Right 06/13/2016   Procedure: SHOULDER DIAGNOSTIC OPERATIVE ARTHROSCOPY WITH LABRAL REPAIR;  Surgeon: Meredith Pel, MD;  Location: [place];  Service: Orthopedics;  Laterality: Right;    There were no vitals filed for this visit.      Subjective Assessment - 09/11/16 1438    Subjective  S: Tomorrow I'm going to see Dr. Marlou Sa.    Currently in Pain? No/denies            [facility] OT Assessment - 09/11/16 1437      Assessment   Diagnosis Right Shoulder Arthroscopy with Bankart Repair     Precautions   Precautions Shoulder   Type of Shoulder Precautions Spoke with MD to clarify any precautions. patient is ok'd to progress as tolerated.     Palpation   Palpation comment Pt with min fascial restrictions in right deltoid, trapezius, and scapularis regions     AROM   Overall AROM Comments Assessed standing, er/IR adducted   AROM Assessment Site Shoulder   Right/Left Shoulder Right   Right Shoulder Flexion 175 Degrees  previously  unassessed   Right Shoulder ABduction 180 Degrees  previously unassessed   Right Shoulder Internal Rotation 90 Degrees  previously unassessed   Right Shoulder External Rotation 72 Degrees  previously unassessed     PROM   Overall PROM Comments Assessed supine, er/IR adducted   PROM Assessment Site Shoulder   Right/Left Shoulder Right   Right Shoulder Flexion 180 Degrees  135 previous   Right Shoulder ABduction 180 Degrees  85 previous   Right Shoulder Internal Rotation 90 Degrees  same as previous   Right Shoulder External Rotation 75 Degrees  18 previous     Strength   Overall Strength Comments Assessed seated, er/IR adducted   Strength Assessment Site Shoulder   Right/Left Shoulder Right   Right Shoulder Flexion 4+/5  previously unassessed   Right Shoulder ABduction 5/5  previously unassessed   Right Shoulder Internal Rotation 4+/5  previously unassessed   Right Shoulder External Rotation 4+/5  previously unassessed                  OT Treatments/Exercises (OP) - 09/11/16 1439      Exercises   Exercises Shoulder     Shoulder Exercises: Supine   Protraction PROM;5 reps;Strengthening;15 reps   Protraction Weight (lbs) 1   Horizontal ABduction PROM;5 reps;Strengthening;15 reps   Horizontal ABduction Weight (lbs) 1  External Rotation PROM;5 reps;Strengthening;15 reps   External Rotation Weight (lbs) 1   Internal Rotation PROM;5 reps;Strengthening;15 reps   Internal Rotation Weight (lbs) 1   Flexion PROM;5 reps;Strengthening;15 reps   Shoulder Flexion Weight (lbs) 1   ABduction PROM;5 reps;AAROM;15 reps   Shoulder ABduction Weight (lbs) 1     Shoulder Exercises: Standing   Protraction Strengthening;15 reps   Protraction Weight (lbs) 1   Horizontal ABduction Strengthening;15 reps   Horizontal ABduction Weight (lbs) 1   External Rotation Strengthening;15 reps   External Rotation Weight (lbs) 1   Internal Rotation Strengthening;15 reps   Internal  Rotation Weight (lbs) 1   Flexion Strengthening;15 reps   Shoulder Flexion Weight (lbs) 1   ABduction Strengthening;15 reps   Shoulder ABduction Weight (lbs) 1   Extension Theraband;10 reps   Theraband Level (Shoulder Extension) Level 3 (Green)   Row [employer] reps   Theraband Level (Shoulder Row) Level 3 (Green)   Retraction Theraband;10 reps   Theraband Level (Shoulder Retraction) Level 3 (Green)     Shoulder Exercises: ROM/Strengthening   Wall Pushups 10 reps   X to V Arms 10X 1# weight   Proximal Shoulder Strengthening, Supine 15X each no rest breaks     Shoulder Exercises: Stretch   External Rotation Stretch 2 reps;10 seconds                OT Education - 09/11/16 1516    Education provided Yes   Education Details updated HEP for green scapular theraband, added x to v arms and er stretch   Person(s) Educated Patient   Methods Explanation;Demonstration;Handout   Comprehension Verbalized understanding;Returned demonstration          OT Short Term Goals - 09/11/16 1455      OT SHORT TERM GOAL #1   Title Pt will be educated on HEP.    Time 3   Period Weeks   Status Achieved     OT SHORT TERM GOAL #2   Title Pt will decrease fascial restrictions from mod to min amounts in RUE to improve mobility when performing functional reaching.    Time 3   Period Weeks   Status Achieved     OT SHORT TERM GOAL #3   Title Pt will increase RUE A/ROM to [facility] to improve ability to reach into overhead cabinets.    Time 3   Period Weeks   Status Achieved     OT SHORT TERM GOAL #4   Title Pt will increase RUE strength to 4-/5 to improve ability to lift lightweight objects to shoulder level.    Time 3   Period Weeks   Status Achieved     OT SHORT TERM GOAL #5   Title Pt will decrease RUE pain to 3/10 during functional task completion.    Time 3   Period Weeks   Status Achieved           OT Long Term Goals - 09/11/16 1456      OT LONG TERM GOAL #1   Title  Pt will return to highest level of independence and functioning using RUE as dominant during daily tasks.    Time 6   Period Weeks   Status Achieved     OT LONG TERM GOAL #2   Title Pt will decrease RUE fascial restrictions to trace amounts or less to improve mobility during work tasks.    Time 6   Period Weeks   Status Not Met  OT LONG TERM GOAL #3   Title Pt will decrease RUE pain to 1/10 or less to improve ability to perform work and ADL tasks.    Time 6   Period Weeks   Status Achieved     OT LONG TERM GOAL #4   Title Pt will improve A/ROM to WNL in RUE to improve ability to perform overhead tasks while working.    Time 6   Period Weeks   Status Achieved     OT LONG TERM GOAL #5   Title Pt will improve RUE strength to 5/5 to improve ability to perform heavy work tasks with independence.    Time 6   Period Weeks   Status Partially Met               Plan - 09/11/16 1517    Clinical Impression Statement A: Reassessment completed this session, pt has met all STGs, 3/5 LTGs and has partially met an additional LTG. Pt reports he is not having any pain and is using his RUE as dominant, continues to be cautious with lifting items. Progressed to strengthening this session, pt required initially verbal cuing for form with exercises. Discussed progress and pt interested in discharging with HEP to continue strengthening, as the copay is getting expensive. Reviewed previously provided HEP and updated with 3 new exercises.    Plan P: Discharge pt   OT Home Exercise Plan 12/1: table slides; 1/3 scapular theraband; 1/10: er stretch, x to v arms, wall push ups, provided green theraband   Consulted and Agree with Plan of Care Patient      Patient will benefit from skilled therapeutic intervention in order to improve the following deficits and impairments:  Decreased activity tolerance, Decreased strength, Impaired flexibility, Decreased range of motion, Pain, Increased fascial  restricitons, Impaired UE functional use  Visit Diagnosis: Stiffness of right shoulder, not elsewhere classified  Other symptoms and signs involving the musculoskeletal system    Problem List Patient Active Problem List   Diagnosis Date Noted  . HIV DISEASE 03/10/2007  . DEPRESSION 03/10/2007  . VISUAL CHANGES 03/10/2007   Guadelupe Sabin, OTR/L  [phone] 09/11/2016, 3:21 PM  [place], [place], [address] Phone: [phone]   Fax:  [phone]  Name: Kyle Gill MRN: [id number] Date of Birth: 1981-03-30     OCCUPATIONAL THERAPY DISCHARGE SUMMARY  Visits from Start of Care: 5  Current functional level related to goals / functional outcomes: See above. Pt has made excellent progress with improving ROM, strength, and functional use of RUE. Pt is able to use RUE as dominant during all daily tasks.    Remaining deficits: Sustained activity tolerance continues to be difficult for pt; lifting weighted objects   Education / Equipment: Reviewed HEP, added x to v arms, er stretch, and wall pushups. Upgraded scapular theraband to green band.  Plan: Patient agrees to discharge.  Patient goals were met. Patient is being discharged due to meeting the stated rehab goals.  ?????

## 2016-09-11 NOTE — Patient Instructions (Signed)
Additional exercises and stretches:    1)  X to V arms (cheerleader move):  Begin with arms straight down, crossed in front of body in an "X". Keeping arms crossed, lift arms straight up overhead. Then spread arms apart into a "V" shape.  Bring back together into x and lower down to starting position.  Complete 10-15X, 1x/day     2) External Rotation Stretch:     Place your affected hand on the wall with the elbow bent and gently turn your body the opposite direction until a stretch is felt. Complete 3-5X, hold for 10 seconds. Complete 1-2x/day.    3) Wall push-ups: standing around 18 inches to 2 feet away from wall, place palms flat on wall and lean into wall then push-up. Complete 5-10X, 1x/day.

## 2016-09-12 ENCOUNTER — Telehealth (INDEPENDENT_AMBULATORY_CARE_PROVIDER_SITE_OTHER): Payer: Self-pay | Admitting: *Deleted

## 2016-09-12 ENCOUNTER — Ambulatory Visit (INDEPENDENT_AMBULATORY_CARE_PROVIDER_SITE_OTHER): Payer: 59 | Admitting: Orthopedic Surgery

## 2016-09-12 ENCOUNTER — Encounter (HOSPITAL_COMMUNITY): Payer: 59 | Admitting: Occupational Therapy

## 2016-09-12 ENCOUNTER — Encounter (INDEPENDENT_AMBULATORY_CARE_PROVIDER_SITE_OTHER): Payer: Self-pay | Admitting: Orthopedic Surgery

## 2016-09-12 DIAGNOSIS — S43491A Other sprain of right shoulder joint, initial encounter: Secondary | ICD-10-CM | POA: Insufficient documentation

## 2016-09-12 DIAGNOSIS — S43491D Other sprain of right shoulder joint, subsequent encounter: Secondary | ICD-10-CM

## 2016-09-12 NOTE — Telephone Encounter (Signed)
faxed

## 2016-09-12 NOTE — Telephone Encounter (Signed)
Pt stated return to work note needed to be faxed to [employer]: Fax: [phone]

## 2016-09-12 NOTE — Progress Notes (Signed)
   Post-Op Visit Note   Patient: Kyle Gill           Date of Birth: 03/08/1981           MRN: [id number] Visit Date: 09/12/2016 PCP: Pershing ProudJACKSON,SAMANTHA, PA-C   Assessment & Plan:  Chief Complaint:  Chief Complaint  Patient presents with  . Right Shoulder - Routine Post Op   Visit Diagnoses:  1. Bankart lesion of right shoulder, subsequent encounter     Plan: Kyle Gill is a 36 year old patient who is now about 13 weeks out from right shoulder arthroscopy with Bankart repair.  He was released from physical therapy yesterday.  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  Taking Robaxin only as needed for spasm symptoms.  On exam he has external rotation at 15 of abduction on the left to about 90 on the right to its 60 with good endpoint.  He has good forward flexion and abduction strength as well as range of motion.  Shoulder feels very stable.  On the right-hand side.  Plan is to release and a return to work Saturday with no restrictions.  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  I'll see him back as needed  Follow-Up Instructions: Return if symptoms worsen or fail to improve.   Orders:  No orders of the defined types were placed in this encounter.  No orders of the defined types were placed in this encounter.    PMFS History: Patient Active Problem List   Diagnosis Date Noted  . Bankart lesion of right shoulder 09/12/2016  . HIV DISEASE 03/10/2007  . DEPRESSION 03/10/2007  . VISUAL CHANGES 03/10/2007   Past Medical History:  Diagnosis Date  . Bell's palsy   . HIV positive (HCC)   . Hypertension   . Pneumonia     No family history on file.  Past Surgical History:  Procedure Laterality Date  . SHOULDER ARTHROSCOPY WITH LABRAL REPAIR Right 06/13/2016   Procedure: SHOULDER DIAGNOSTIC OPERATIVE ARTHROSCOPY WITH LABRAL REPAIR;  Surgeon: Cammy CopaScott Gregory Dean, MD;  Location: [facility] OR;   Service: Orthopedics;  Laterality: Right;   Social History   Occupational History  . Not on file.   Social History Main Topics  . Smoking status: Current Every Day Smoker    Packs/day: 0.50    Types: Cigarettes  . Smokeless tobacco: Never Used  . Alcohol use Yes     Comment: occasionally  . Drug use:     Types: Marijuana     Comment: today  . Sexual activity: Yes    Birth control/ protection: None

## 2016-09-18 ENCOUNTER — Ambulatory Visit (INDEPENDENT_AMBULATORY_CARE_PROVIDER_SITE_OTHER): Payer: 59 | Admitting: Orthopedic Surgery

## 2016-11-14 MED FILL — IBUPROFEN 800 MG TABLET: 800 | 6 days supply | Qty: 20 | Fill #0

## 2016-11-14 MED FILL — CLINDAMYCIN HCL 300 MG CAPS: 300 | 7 days supply | Qty: 28 | Fill #0

## 2017-02-04 DIAGNOSIS — M79671 Pain in right foot: Secondary | ICD-10-CM | POA: Diagnosis not present

## 2017-02-04 DIAGNOSIS — B353 Tinea pedis: Secondary | ICD-10-CM | POA: Diagnosis not present

## 2017-02-04 MED FILL — CICLOPIROX 0.77% GEL: 0.77 | 14 days supply | Qty: 30 | Fill #0

## 2017-02-25 DIAGNOSIS — B353 Tinea pedis: Secondary | ICD-10-CM | POA: Diagnosis not present

## 2017-02-26 MED FILL — CICLOPIROX 0.77% GEL: 0.77 | 14 days supply | Qty: 30 | Fill #1

## 2017-03-11 DIAGNOSIS — M79671 Pain in right foot: Secondary | ICD-10-CM | POA: Diagnosis not present

## 2017-03-11 DIAGNOSIS — S86311A Strain of muscle(s) and tendon(s) of peroneal muscle group at lower leg level, right leg, initial encounter: Secondary | ICD-10-CM | POA: Diagnosis not present

## 2017-03-11 MED FILL — MELOXICAM 15 MG TABLET: 15 | 30 days supply | Qty: 30 | Fill #0

## 2017-03-25 DIAGNOSIS — M79671 Pain in right foot: Secondary | ICD-10-CM | POA: Diagnosis not present

## 2017-03-25 DIAGNOSIS — M25571 Pain in right ankle and joints of right foot: Secondary | ICD-10-CM | POA: Diagnosis not present

## 2017-03-25 DIAGNOSIS — S86311D Strain of muscle(s) and tendon(s) of peroneal muscle group at lower leg level, right leg, subsequent encounter: Secondary | ICD-10-CM | POA: Diagnosis not present

## 2017-04-08 DIAGNOSIS — S86311D Strain of muscle(s) and tendon(s) of peroneal muscle group at lower leg level, right leg, subsequent encounter: Secondary | ICD-10-CM | POA: Diagnosis not present

## 2017-04-08 DIAGNOSIS — M79671 Pain in right foot: Secondary | ICD-10-CM | POA: Diagnosis not present

## 2017-04-08 DIAGNOSIS — M25571 Pain in right ankle and joints of right foot: Secondary | ICD-10-CM | POA: Diagnosis not present

## 2017-07-08 DIAGNOSIS — H52223 Regular astigmatism, bilateral: Secondary | ICD-10-CM | POA: Diagnosis not present

## 2017-10-06 ENCOUNTER — Emergency Department (HOSPITAL_COMMUNITY)
Admission: EM | Admit: 2017-10-06 | Discharge: 2017-10-06 | Disposition: A | Discharge status: Home / Self Care | Payer: 59 | Attending: Emergency Medicine | Admitting: Emergency Medicine

## 2017-10-06 ENCOUNTER — Emergency Department (HOSPITAL_COMMUNITY): Payer: 59

## 2017-10-06 ENCOUNTER — Encounter (HOSPITAL_COMMUNITY): Payer: Self-pay | Admitting: Emergency Medicine

## 2017-10-06 ENCOUNTER — Other Ambulatory Visit: Payer: Self-pay

## 2017-10-06 DIAGNOSIS — Z79899 Other long term (current) drug therapy: Secondary | ICD-10-CM | POA: Insufficient documentation

## 2017-10-06 DIAGNOSIS — J111 Influenza due to unidentified influenza virus with other respiratory manifestations: Secondary | ICD-10-CM | POA: Diagnosis not present

## 2017-10-06 DIAGNOSIS — B20 Human immunodeficiency virus [HIV] disease: Secondary | ICD-10-CM | POA: Insufficient documentation

## 2017-10-06 DIAGNOSIS — R6889 Other general symptoms and signs: Secondary | ICD-10-CM

## 2017-10-06 DIAGNOSIS — R05 Cough: Secondary | ICD-10-CM | POA: Diagnosis not present

## 2017-10-06 DIAGNOSIS — F17210 Nicotine dependence, cigarettes, uncomplicated: Secondary | ICD-10-CM | POA: Diagnosis not present

## 2017-10-06 DIAGNOSIS — I10 Essential (primary) hypertension: Secondary | ICD-10-CM | POA: Diagnosis not present

## 2017-10-06 DIAGNOSIS — R197 Diarrhea, unspecified: Secondary | ICD-10-CM | POA: Diagnosis not present

## 2017-10-06 LAB — INFLUENZA PANEL BY PCR (TYPE A & B)
INFLBPCR: NEGATIVE
Influenza A By PCR: NEGATIVE

## 2017-10-06 LAB — CBC WITH DIFFERENTIAL/PLATELET
BASOS ABS: 0 10*3/uL (ref 0.0–0.1)
Basophils Relative: 1 %
Eosinophils Absolute: 0.1 10*3/uL (ref 0.0–0.7)
Eosinophils Relative: 1 %
HEMATOCRIT: 43.3 % (ref 39.0–52.0)
Hemoglobin: 15 g/dL (ref 13.0–17.0)
LYMPHS ABS: 2.1 10*3/uL (ref 0.7–4.0)
LYMPHS PCT: 34 %
MCH: 30 pg (ref 26.0–34.0)
MCHC: 34.6 g/dL (ref 30.0–36.0)
MCV: 86.6 fL (ref 78.0–100.0)
MONO ABS: 0.8 10*3/uL (ref 0.1–1.0)
MONOS PCT: 12 %
NEUTROS ABS: 3.2 10*3/uL (ref 1.7–7.7)
Neutrophils Relative %: 52 %
PLATELETS: 270 10*3/uL (ref 150–400)
RBC: 5 MIL/uL (ref 4.22–5.81)
RDW: 12.3 % (ref 11.5–15.5)
WBC: 6.2 10*3/uL (ref 4.0–10.5)

## 2017-10-06 LAB — BASIC METABOLIC PANEL
ANION GAP: 11 (ref 5–15)
BUN: 8 mg/dL (ref 6–20)
CO2: 26 mmol/L (ref 22–32)
Calcium: 9.1 mg/dL (ref 8.9–10.3)
Chloride: 100 mmol/L — ABNORMAL LOW (ref 101–111)
Creatinine, Ser: 0.98 mg/dL (ref 0.61–1.24)
GFR calc Af Amer: >60 mL/min (ref >60)
GLUCOSE: 90 mg/dL (ref 65–99)
POTASSIUM: 3.8 mmol/L (ref 3.5–5.1)
Sodium: 137 mmol/L (ref 135–145)

## 2017-10-06 MED ORDER — ALBUTEROL SULFATE (2.5 MG/3ML) 0.083% IN NEBU
2.5000 mg | INHALATION_SOLUTION | Freq: Once | RESPIRATORY_TRACT | Status: AC
  Administered 2017-10-06: 2.5 mg via RESPIRATORY_TRACT
  Filled 2017-10-06: qty 3

## 2017-10-06 MED ORDER — HYDROCOD POLST-CPM POLST ER 10-8 MG/5ML PO SUER: 5.0000 mL | Freq: Two times a day (BID) | ORAL | 0 refills | Status: DC | PRN

## 2017-10-06 MED ORDER — LOPERAMIDE HCL 2 MG PO CAPS: 2.0000 mg | ORAL_CAPSULE | Freq: Four times a day (QID) | ORAL | 0 refills | Status: DC | PRN

## 2017-10-06 MED ORDER — IPRATROPIUM-ALBUTEROL 0.5-2.5 (3) MG/3ML IN SOLN
3.0000 mL | Freq: Once | RESPIRATORY_TRACT | Status: AC
Start: 2017-10-06 — End: 2017-10-06
  Administered 2017-10-06: 3 mL via RESPIRATORY_TRACT
  Filled 2017-10-06: qty 3

## 2017-10-06 NOTE — Discharge Instructions (Signed)
Your labs and chest xray is normal today, but your CD4 count has not yet resulted.  Rest and make sure you are drinking plenty of fluids.  You would benefit from having a discussion with an infectious disease specialist as there are a lot of new options for HIV care and treatment.  Do not drive within 4 hours of taking the cough syrup prescribed as this will make you sleepy.

## 2017-10-06 NOTE — ED Provider Notes (Signed)
[facility] EMERGENCY DEPARTMENT Provider Note   CSN: [id number] Arrival date & time: 10/06/17  7829     History   Chief Complaint Chief Complaint  Patient presents with  . Diarrhea  . Influenza    HPI Kyle Gill is a 37 y.o. male with a history of hypertension, pneumonia and is HIV positive but reports has not been treated or followed for this infection for the past 18 years as he states he feels HIV meds do more harm than good, presenting with a one-week history of cough, wheezing, shortness of breath, night sweats and intermittent diarrhea.  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  He endorses wearing the full hood protection while he was in the room.  He denies hemoptysis,  he has had subjective fever and has had profound night sweats for the past week stating he has to change his t shirt several times nightly as he wakes soaking wet.  The diarrhea has been intermittent, mild, last episode occurred this morning.  He denies nausea or vomiting, abdominal pain or other complaints except for generalized fatigue.  He has taken Mucinex for his cough.  The history is provided by the patient.    Past Medical History:  Diagnosis Date  . Bell's palsy   . HIV positive (HCC)   . Hypertension   . Pneumonia     Patient Active Problem List   Diagnosis Date Noted  . Bankart lesion of right shoulder 09/12/2016  . HIV DISEASE 03/10/2007  . DEPRESSION 03/10/2007  . VISUAL CHANGES 03/10/2007    Past Surgical History:  Procedure Laterality Date  . SHOULDER ARTHROSCOPY WITH LABRAL REPAIR Right 06/13/2016   Procedure: SHOULDER DIAGNOSTIC OPERATIVE ARTHROSCOPY WITH LABRAL REPAIR;  Surgeon: Cammy Copa, MD;  Location: [facility] OR;  Service: Orthopedics;  Laterality: Right;       Home Medications    Prior to Admission medications   Medication Sig Start Date  End Date Taking? Authorizing Provider  chlorpheniramine-HYDROcodone (TUSSIONEX PENNKINETIC ER) 10-8 MG/5ML SUER Take 5 mLs by mouth every 12 (twelve) hours as needed for cough. 10/06/17   Burgess Amor, PA-C  loperamide (IMODIUM) 2 MG capsule Take 1 capsule (2 mg total) by mouth 4 (four) times daily as needed for diarrhea or loose stools. 10/06/17   Burgess Amor, PA-C  methocarbamol (ROBAXIN) 500 MG tablet Take 1 tablet (500 mg total) by mouth every 8 (eight) hours as needed for muscle spasms. Patient not taking: Reported on 10/06/2017 06/13/16   Cammy Copa, MD  oxyCODONE-acetaminophen (ROXICET) 5-325 MG tablet Take 1-2 tablets by mouth every 6 (six) hours as needed for severe pain. Patient not taking: Reported on 09/12/2016 06/13/16   Cammy Copa, MD    Family History History reviewed. No pertinent family history.  Social History Social History   Tobacco Use  . Smoking status: Current Every Day Smoker    Packs/day: 0.50    Types: Cigarettes  . Smokeless tobacco: Never Used  Substance Use Topics  . Alcohol use: Yes    Comment: occasionally  . Drug use: Yes    Types: Marijuana     Allergies   Aspirin; Penicillins; and Sulfamethoxazole-trimethoprim   Review of Systems Review of Systems  Constitutional: Positive for diaphoresis, fatigue and fever.  HENT: Negative for congestion and sore throat.   Eyes: Negative.   Respiratory:  Positive for cough and wheezing. Negative for chest tightness and shortness of breath.   Cardiovascular: Negative for chest pain.  Gastrointestinal: Positive for diarrhea. Negative for abdominal pain, nausea and vomiting.  Genitourinary: Negative.   Musculoskeletal: Negative for arthralgias, joint swelling and neck pain.  Skin: Negative.  Negative for rash and wound.  Neurological: Negative for dizziness, weakness, light-headedness, numbness and headaches.  Psychiatric/Behavioral: Negative.      Physical Exam Updated Vital Signs BP 129/81    Pulse 81   Temp 99.3 F (37.4 C) (Oral)   Resp 20   SpO2 95%   Physical Exam  Constitutional: He appears well-developed and well-nourished.  HENT:  Head: Normocephalic and atraumatic.  Eyes: Conjunctivae are normal.  Neck: Normal range of motion.  Cardiovascular: Normal rate, regular rhythm, normal heart sounds and intact distal pulses.  Pulmonary/Chest: Effort normal. No respiratory distress. He has wheezes.  Expiratory wheeze bilateral lungs.  Abdominal: Soft. Bowel sounds are normal. He exhibits no mass. There is no tenderness. There is no guarding.  Musculoskeletal: Normal range of motion.  Neurological: He is alert.  Skin: Skin is warm and dry.  Psychiatric: He has a normal mood and affect.  Nursing note and vitals reviewed.    ED Treatments / Results  Labs (all labs ordered are listed, but only abnormal results are displayed) Labs Reviewed  BASIC METABOLIC PANEL - Abnormal; Notable for the following components:      Result Value   Chloride 100 (*)    All other components within normal limits  CULTURE, BLOOD (ROUTINE X 2)  CULTURE, BLOOD (ROUTINE X 2)  INFLUENZA PANEL BY PCR (TYPE A & B)  CBC WITH DIFFERENTIAL/PLATELET  T-HELPER CELLS (CD4) COUNT (NOT AT [facility])    EKG  EKG Interpretation None       Radiology Dg Chest 2 View  Result Date: 10/06/2017 CLINICAL DATA:  Congestion x 1 month. Cough,sob, fever, weakness, and body aches x 1 week. Hx of pna years ago. EXAM: CHEST  2 VIEW COMPARISON:  07/22/2014 FINDINGS: Midline trachea. Normal heart size and mediastinal contours. No pleural effusion or pneumothorax. Minimal biapical pleural thickening. Clear lungs. IMPRESSION: No acute cardiopulmonary disease. Electronically Signed   By: Jeronimo GreavesKyle  Talbot M.D.   On: 10/06/2017 09:23    Procedures Procedures (including critical care time)  Medications Ordered in ED Medications  ipratropium-albuterol (DUONEB) 0.5-2.5 (3) MG/3ML nebulizer solution 3 mL (3 mLs Nebulization  Given 10/06/17 0930)  albuterol (PROVENTIL) (2.5 MG/3ML) 0.083% nebulizer solution 2.5 mg (2.5 mg Nebulization Given 10/06/17 0930)     Initial Impression / Assessment and Plan / ED Course  I have reviewed the triage vital signs and the nursing notes.  Pertinent labs & imaging results that were available during my care of the patient were reviewed by me and considered in my medical decision making (see chart for details).     Pt with stable labs and vital signs, cxr negative for pneumonia, CD4 count pending.  He was referred to infectious disease and agrees to f/u for at least one visit to discuss newer tx options for HIV infection.  Discussed, rest, increased fluids, tussionex prescribed, imodium if needed for diarrhea (no diarrhea while here). Pt seen by Dr Jacqulyn BathLong during todays visit.  The patient appears reasonably screened and/or stabilized for discharge and I doubt any other medical condition or other [facility] requiring further screening, evaluation, or treatment in the ED at this time prior to discharge.   Final Clinical Impressions(s) / ED Diagnoses  Final diagnoses:  Flu-like symptoms  Diarrhea, unspecified type    ED Discharge Orders        Ordered    loperamide (IMODIUM) 2 MG capsule  4 times daily PRN     10/06/17 1303    chlorpheniramine-HYDROcodone (TUSSIONEX PENNKINETIC ER) 10-8 MG/5ML SUER  Every 12 hours PRN     10/06/17 1314       Burgess Amor, PA-C 10/06/17 1750    Long, Arlyss Repress, MD 10/06/17 [phone]

## 2017-10-06 NOTE — ED Notes (Signed)
Fan given for pt comfort. Rt aware of tx. Nad.

## 2017-10-06 NOTE — ED Triage Notes (Signed)
Pt c/o fever, diarrhea, gen weakness x 1 week now with some dizziness. Denies vomiting

## 2017-10-07 LAB — T-HELPER CELLS (CD4) COUNT (NOT AT ARMC)
CD4 T CELL HELPER: 16 % — AB (ref 33–55)
CD4 T Cell Abs: 330 /uL — ABNORMAL LOW (ref 400–2700)

## 2017-10-11 LAB — CULTURE, BLOOD (ROUTINE X 2)
CULTURE: NO GROWTH
CULTURE: NO GROWTH
SPECIAL REQUESTS: ADEQUATE
Special Requests: ADEQUATE

## 2017-10-12 ENCOUNTER — Telehealth: Payer: Self-pay

## 2017-10-12 NOTE — Telephone Encounter (Signed)
[facility] 10[facility]17 per Samella Parrony R. Pharm D

## 2017-10-24 ENCOUNTER — Telehealth: Payer: 59 | Admitting: Family

## 2017-10-24 DIAGNOSIS — Z719 Counseling, unspecified: Secondary | ICD-10-CM

## 2017-10-24 NOTE — Progress Notes (Signed)
Thank you for the details you included in the comment boxes. Those details are very helpful in determining the best course of treatment for you and help us to provide the best care. The visit says "Vaginal discharge." but says sores on genitals as you are male. This is something that cannot be treated with an E-Visit consultation.  Based on what you shared with me it looks like you have a serious condition that should be evaluated in a face to face office visit.  NOTE: If you entered your credit card information for this eVisit, you will not be charged. You may see a "hold" on your card for the $30 but that hold will drop off and you will not have a charge processed.  If you are having a true medical emergency please call 911.  If you need an urgent face to face visit, [place] has four urgent care centers for your convenience.  If you need care fast and have a high deductible or no insurance consider:   [email or web address] to reserve your spot online an avoid wait times  [facility]  [address], [address] [place], [place] [address] 8 am to 8 pm Monday-Friday 10 am to 4 pm Saturday-Sunday *Across the street from [employer]  [facility]  [address] [place], [address] 8 am to 5 pm Monday-Friday * In the [facility] on the [facility]   The following sites will take your  insurance:  . [facility]  [phone] Get Driving Directions Find a Provider at this Location  [address] [place], [place] [address] . 10 am to 8 pm Monday-Friday . 12 pm to 8 pm Saturday-Sunday   . [facility]  [phone] Get Driving Directions Find a Provider at this Location  [address] [place], [place] [address] . 8 am to 8 pm Monday-Friday . 9 am to 6 pm Saturday . 11 am to 6 pm Sunday   . [facility] [place]  [phone] Get Driving  Directions  [address] [place], [place] [address] . 8 am to 8 pm Monday-Friday . 8 am to 4 pm Saturday-Sunday   Your e-visit answers were reviewed by a board certified advanced clinical practitioner to complete your personal care plan.  Thank you for using e-Visits.

## 2017-12-11 ENCOUNTER — Other Ambulatory Visit: Payer: Self-pay | Admitting: Orthopedic Surgery

## 2017-12-11 MED ORDER — PREDNISONE 10 MG (48) PO TBPK
ORAL_TABLET | Freq: Every day | ORAL | 0 refills | Status: DC
Start: 2017-12-11 — End: 2018-01-17

## 2017-12-11 MED ORDER — GABAPENTIN 300 MG PO CAPS: 300.0000 mg | ORAL_CAPSULE | Freq: Every day | ORAL | 0 refills | Status: DC

## 2017-12-11 NOTE — Progress Notes (Signed)
sterapred 

## 2017-12-16 ENCOUNTER — Telehealth: Payer: Self-pay | Admitting: Orthopedic Surgery

## 2017-12-16 NOTE — Telephone Encounter (Signed)
Patient called with questions - aware of upcoming appointment Thursday, 12/18/17; states Dr Romeo AppleHarrison prescribed prednisone, and mentioned side effects. Also asking if "Dr Romeo AppleHarrison can start ordering an MRI" - I relayed that there is a process for this, including approval by insurance.  Please call patient.

## 2017-12-16 NOTE — Telephone Encounter (Signed)
Called him to discuss, he is aware will need exam first and Dr Romeo AppleHarrison will order MRI if needed.

## 2017-12-18 ENCOUNTER — Ambulatory Visit (INDEPENDENT_AMBULATORY_CARE_PROVIDER_SITE_OTHER): Payer: 59 | Admitting: Orthopedic Surgery

## 2017-12-18 ENCOUNTER — Encounter: Payer: Self-pay | Admitting: Orthopedic Surgery

## 2017-12-18 ENCOUNTER — Ambulatory Visit (INDEPENDENT_AMBULATORY_CARE_PROVIDER_SITE_OTHER): Payer: 59

## 2017-12-18 VITALS — BP 134/88 | HR 97 | Ht 75.5 in | Wt 220.0 lb

## 2017-12-18 DIAGNOSIS — S76302A Unspecified injury of muscle, fascia and tendon of the posterior muscle group at thigh level, left thigh, initial encounter: Secondary | ICD-10-CM

## 2017-12-18 NOTE — Progress Notes (Signed)
NEW PATIENT OFFICE VISIT   Chief Complaint  Patient presents with  . Leg Pain    left hamstring pain since 11/22/17      MEDICAL DECISION SECTION  xrays ordered? y  My independent reading of xrays: AP pelvis shows no fracture normal hip joints   Encounter Diagnosis  Name Primary?  . Hamstring injury, left, initial encounter Yes     PLAN: Recommend symptomatic treatment  Finish the prednisone pack and continue the gabapentin   Injection? no MRI/CT/? no   Chief Complaint  Patient presents with  . 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  It was not planned.  He complains of pain in his left ischium increased with sitting and increased with flexion of the waist.  Pain is dull it radiates in a small area around the ischium is it associated with some numbness on occasion he has had it for approximately 3 weeks  He is on prednisone and gabapentin which I called in because we could not get him into the office he says that is helping and overall his pain is decreasing he still has some pain with sitting   Review of Systems  Musculoskeletal: Positive for back pain.  Neurological: Positive for tingling.     Past Medical History:  Diagnosis Date  . Bell's palsy   . HIV positive (HCC)   . Hypertension   . Pneumonia     Past Surgical History:  Procedure Laterality Date  . SHOULDER ARTHROSCOPY WITH LABRAL REPAIR Right 06/13/2016   Procedure: SHOULDER DIAGNOSTIC OPERATIVE ARTHROSCOPY WITH LABRAL REPAIR;  Surgeon: Cammy Copa, MD;  Location: [facility] OR;  Service: Orthopedics;  Laterality: Right;    History reviewed. No pertinent family history. Social History   Tobacco Use  . Smoking status: Current Every Day Smoker    Packs/day: 0.50    Types: Cigarettes  . Smokeless tobacco: Never Used  Substance Use Topics  . Alcohol use: Yes    Comment: occasionally  . Drug use: Yes    Types:  Marijuana    Current Meds  Medication Sig  . gabapentin (NEURONTIN) 300 MG capsule Take 1 capsule (300 mg total) by mouth at bedtime.  . predniSONE (STERAPRED UNI-PAK 48 TAB) 10 MG (48) TBPK tablet Take by mouth daily. 10 mg DS 12 day as directed    BP 134/88   Pulse 97   Ht 6' 3.5" (1.918 m)   Wt 220 lb (99.8 kg)   BMI 27.14 kg/m   Physical Exam  Constitutional: He is oriented to person, place, and time. He appears well-developed and well-nourished.  Vital signs have been reviewed and are stable. Gen. appearance the patient is well-developed and well-nourished with normal grooming and hygiene.   Neurological: He is alert and oriented to person, place, and time.  Skin: Skin is warm and dry. No erythema.  Psychiatric: He has a normal mood and affect.  Vitals reviewed.   Right Hip Exam  Right hip exam is normal.   Tenderness  The patient is experiencing no tenderness.   Range of Motion  The patient has normal right hip ROM.  Muscle Strength  The patient has normal right hip strength.  Tests  FABER: negative  Other  Erythema: absent Sensation: normal Pulse: present  Comments:  HIP STABILITY NORMAL    Left Hip Exam  Left hip exam is normal.  Tenderness  The  patient is experiencing no tenderness.   Range of Motion  The patient has normal left hip ROM.  Muscle Strength  The patient has normal left hip strength.   Tests  FABER: negative  Other  Erythema: absent Sensation: normal Pulse: present  Comments:  HIP STABILITY NORMAL   Tenderness over the left ischium and proximal portion of the hamstring, there is no palpable defect and no weakness in the hamstring.

## 2017-12-18 NOTE — Patient Instructions (Signed)
Follow up 3-4 weeks

## 2018-01-12 ENCOUNTER — Ambulatory Visit: Payer: 59 | Admitting: Orthopedic Surgery

## 2018-01-14 ENCOUNTER — Encounter: Payer: Self-pay | Admitting: Orthopedic Surgery

## 2018-01-17 ENCOUNTER — Emergency Department (HOSPITAL_COMMUNITY)
Admission: EM | Admit: 2018-01-17 | Discharge: 2018-01-17 | Disposition: A | Discharge status: Home / Self Care | Payer: PRIVATE HEALTH INSURANCE | Attending: Emergency Medicine | Admitting: Emergency Medicine

## 2018-01-17 ENCOUNTER — Encounter (HOSPITAL_COMMUNITY): Payer: Self-pay | Admitting: Emergency Medicine

## 2018-01-17 ENCOUNTER — Other Ambulatory Visit: Payer: Self-pay

## 2018-01-17 ENCOUNTER — Emergency Department (HOSPITAL_COMMUNITY): Payer: PRIVATE HEALTH INSURANCE

## 2018-01-17 DIAGNOSIS — R51 Headache: Secondary | ICD-10-CM | POA: Insufficient documentation

## 2018-01-17 DIAGNOSIS — Z21 Asymptomatic human immunodeficiency virus [HIV] infection status: Secondary | ICD-10-CM | POA: Insufficient documentation

## 2018-01-17 DIAGNOSIS — S0003XA Contusion of scalp, initial encounter: Secondary | ICD-10-CM | POA: Insufficient documentation

## 2018-01-17 DIAGNOSIS — Z79899 Other long term (current) drug therapy: Secondary | ICD-10-CM | POA: Diagnosis not present

## 2018-01-17 DIAGNOSIS — F17210 Nicotine dependence, cigarettes, uncomplicated: Secondary | ICD-10-CM | POA: Diagnosis not present

## 2018-01-17 DIAGNOSIS — Y939 Activity, unspecified: Secondary | ICD-10-CM | POA: Diagnosis not present

## 2018-01-17 DIAGNOSIS — Y929 Unspecified place or not applicable: Secondary | ICD-10-CM | POA: Insufficient documentation

## 2018-01-17 DIAGNOSIS — R42 Dizziness and giddiness: Secondary | ICD-10-CM | POA: Diagnosis not present

## 2018-01-17 DIAGNOSIS — S0990XA Unspecified injury of head, initial encounter: Secondary | ICD-10-CM

## 2018-01-17 DIAGNOSIS — W208XXA Other cause of strike by thrown, projected or falling object, initial encounter: Secondary | ICD-10-CM | POA: Insufficient documentation

## 2018-01-17 DIAGNOSIS — Y999 Unspecified external cause status: Secondary | ICD-10-CM | POA: Diagnosis not present

## 2018-01-17 DIAGNOSIS — I10 Essential (primary) hypertension: Secondary | ICD-10-CM | POA: Diagnosis not present

## 2018-01-17 MED ORDER — ACETAMINOPHEN 325 MG PO TABS
650.0000 mg | ORAL_TABLET | Freq: Once | ORAL | Status: AC
  Administered 2018-01-17: 650 mg via ORAL
  Filled 2018-01-17: qty 2

## 2018-01-17 NOTE — ED Notes (Signed)
Patient states he hit the top of his head on a metal gate coming from the ceiling. Complained of some dizziness when the incident happened but denies LOC or fall. He is alert and oriented at this time.

## 2018-01-17 NOTE — ED Notes (Signed)
Ice pack applied to top of patient's head, tolerating well

## 2018-01-17 NOTE — ED Provider Notes (Signed)
[facility] EMERGENCY DEPARTMENT Provider Note   CSN: [id number] Arrival date & time: 01/17/18  1234     History   Chief Complaint Chief Complaint  Patient presents with  . Head Injury    HPI Kyle Gill is a 37 y.o. male.  HPI  Pt was seen at 1330. Per pt, c/o sudden onset and resolution of one episode of head injury that occurred PTA. Pt states he hit his head on a metal gait that was coming down from the ceiling. States he initially was "dizzy," which has since improved. Pt continues to c/o headache on the top of his head. Denies LOC, no AMS, no neck or back pain, no focal motor weakness, no tingling/numbness in extremities, no CP/SOB, no abd pain, no N/V/D.   Past Medical History:  Diagnosis Date  . Bell's palsy   . HIV positive (HCC)   . Hypertension   . Pneumonia     Patient Active Problem List   Diagnosis Date Noted  . Bankart lesion of right shoulder 09/12/2016  . HIV DISEASE 03/10/2007  . DEPRESSION 03/10/2007  . VISUAL CHANGES 03/10/2007    Past Surgical History:  Procedure Laterality Date  . SHOULDER ARTHROSCOPY WITH LABRAL REPAIR Right 06/13/2016   Procedure: SHOULDER DIAGNOSTIC OPERATIVE ARTHROSCOPY WITH LABRAL REPAIR;  Surgeon: Cammy Copa, MD;  Location: [facility] OR;  Service: Orthopedics;  Laterality: Right;        Home Medications    Prior to Admission medications   Medication Sig Start Date End Date Taking? Authorizing Provider  gabapentin (NEURONTIN) 300 MG capsule Take 1 capsule (300 mg total) by mouth at bedtime. 12/11/17   Vickki Hearing, MD  predniSONE (STERAPRED UNI-PAK 48 TAB) 10 MG (48) TBPK tablet Take by mouth daily. 10 mg DS 12 day as directed 12/11/17   Vickki Hearing, MD    Family History No family history on file.  Social History Social History   Tobacco Use  . Smoking status: Current Every Day Smoker    Packs/day: 0.50    Types: Cigarettes  . Smokeless tobacco: Never Used  Substance Use Topics  . Alcohol  use: Yes    Comment: occasionally  . Drug use: Yes    Types: Marijuana     Allergies   Aspirin; Penicillins; Sulfamethoxazole-trimethoprim; and Chlorhexidine   Review of Systems Review of Systems ROS: Statement: All systems negative except as marked or noted in the HPI; Constitutional: Negative for fever and chills. ; ; Eyes: Negative for eye pain, redness and discharge. ; ; ENMT: Negative for ear pain, hoarseness, nasal congestion, sinus pressure and sore throat. ; ; Cardiovascular: Negative for chest pain, palpitations, diaphoresis, dyspnea and peripheral edema. ; ; Respiratory: Negative for cough, wheezing and stridor. ; ; Gastrointestinal: Negative for nausea, vomiting, diarrhea, abdominal pain, blood in stool, hematemesis, jaundice and rectal bleeding. . ; ; Genitourinary: Negative for dysuria, flank pain and hematuria. ; ; Musculoskeletal: +head injury. Negative for back pain and neck pain. Negative for swelling and deformity.; ; Skin: +bruising. Negative for pruritus, rash, abrasions, blisters, and skin lesion.; ; Neuro: Negative for headache, lightheadedness and neck stiffness. Negative for weakness, altered level of consciousness, altered mental status, extremity weakness, paresthesias, involuntary movement, seizure and syncope.       Physical Exam Updated Vital Signs BP (!) 149/96 (BP Location: Right Arm)   Pulse 75   Temp 98 F (36.7 C) (Oral)   Resp 17   Ht  (1.905 m)  Wt 104.3 kg (230 lb)   SpO2 100%   BMI 28.75 kg/m   Physical Exam 1335: Physical examination: Vital signs and O2 SAT: Reviewed; Constitutional: Well developed, Well nourished, Well hydrated, In no acute distress; Head and Face: Normocephalic, No scalp hematomas, no lacs. +faint ecchymosis to vertex scalp, no open wounds.  Non-tender to palp superior and inferior orbital rim areas.  No zygoma tenderness.  No mandibular tenderness.; Eyes: EOMI, PERRL, No scleral icterus; ENMT: Mouth and pharynx normal,  Left TM normal, Right TM normal, Mucous membranes moist, +teeth and tongue intact.  No intraoral or intranasal bleeding.  No septal hematomas.  No trismus, no malocclusion.; Neck: Supple, Full range of motion, No lymphadenopathy; Spine: No midline CS, TS, LS tenderness.; Cardiovascular: Regular rate and rhythm, No gallop; Respiratory: Breath sounds clear & equal bilaterally, No wheezes, Normal respiratory effort/excursion; Chest: Nontender, No deformity, Movement normal, No crepitus; Abdomen: Soft, Nontender, Nondistended, Normal bowel sounds;; Extremities: No deformity, Full range of motion, Neurovascularly intact, Pulses normal, No edema; Neuro: AA&Ox3, Gait normal, Normal coordination, Normal speech, No facial droop, Major CN grossly intact.  No gross focal motor or sensory deficits in extremities.; Skin: Color normal, Warm, Dry   ED Treatments / Results  Labs (all labs ordered are listed, but only abnormal results are displayed)   EKG None  Radiology   Procedures Procedures (including critical care time)  Medications Ordered in ED Medications - No data to display   Initial Impression / Assessment and Plan / ED Course  I have reviewed the triage vital signs and the nursing notes.  Pertinent labs & imaging results that were available during my care of the patient were reviewed by me and considered in my medical decision making (see chart for details).  MDM Reviewed: previous chart, nursing note and vitals Interpretation: CT scan    Ct Head Wo Contrast Result Date: 01/17/2018 CLINICAL DATA:  Hit top of head on gate at work. Persistent headache. EXAM: CT HEAD WITHOUT CONTRAST TECHNIQUE: Contiguous axial images were obtained from the base of the skull through the vertex without intravenous contrast. COMPARISON:  None. FINDINGS: Brain: No evidence of acute infarction, hemorrhage, hydrocephalus, extra-axial collection or mass lesion/mass effect. Vascular: No hyperdense vessel or  unexpected calcification. Skull: Normal. Negative for fracture or focal lesion. Sinuses/Orbits: Orbits are within normal. Paranasal sinuses are well developed with minimal focal opacification over the anterior left ethmoid air cells and mild opacification of the inferior right mastoid air cells. Other: None. IMPRESSION: No acute findings. Electronically Signed   By: Elberta Fortis M.D.   On: 01/17/2018 14:10    1440:  CT reassuring. Tx symptomatically at this time. Dx and testing d/w pt.  Questions answered.  Verb understanding, agreeable to d/c home with outpt f/u.    Final Clinical Impressions(s) / ED Diagnoses   Final diagnoses:  None    ED Discharge Orders    None       Samuel Jester, DO 01/21/18 1610

## 2018-01-17 NOTE — Discharge Instructions (Addendum)
Take over the counter tylenol, as directed on packaging, as needed for discomfort.  No sports or activities that would put you at risk for another hit to your head, for the next 2 weeks, and seen in follow up by your regular medical doctor.  Call your regular medical doctor and Employee [facility] on Monday to schedule a follow up appointment within the next 2 to 3 days.  Return to the Emergency Department immediately sooner if worsening.

## 2018-01-17 NOTE — ED Triage Notes (Signed)
Hit top of head on gate in cafeteria at work. C/o headache

## 2018-05-11 ENCOUNTER — Encounter (HOSPITAL_COMMUNITY): Payer: Self-pay

## 2018-05-11 ENCOUNTER — Emergency Department (HOSPITAL_COMMUNITY): Payer: Self-pay

## 2018-05-11 ENCOUNTER — Other Ambulatory Visit: Payer: Self-pay

## 2018-05-11 ENCOUNTER — Emergency Department (HOSPITAL_COMMUNITY)
Admission: EM | Admit: 2018-05-11 | Discharge: 2018-05-11 | Disposition: A | Discharge status: Home / Self Care | Payer: Self-pay | Attending: Emergency Medicine | Admitting: Emergency Medicine

## 2018-05-11 DIAGNOSIS — J069 Acute upper respiratory infection, unspecified: Secondary | ICD-10-CM | POA: Insufficient documentation

## 2018-05-11 DIAGNOSIS — F17210 Nicotine dependence, cigarettes, uncomplicated: Secondary | ICD-10-CM | POA: Insufficient documentation

## 2018-05-11 DIAGNOSIS — I10 Essential (primary) hypertension: Secondary | ICD-10-CM | POA: Insufficient documentation

## 2018-05-11 DIAGNOSIS — Z21 Asymptomatic human immunodeficiency virus [HIV] infection status: Secondary | ICD-10-CM | POA: Insufficient documentation

## 2018-05-11 DIAGNOSIS — Z79899 Other long term (current) drug therapy: Secondary | ICD-10-CM | POA: Insufficient documentation

## 2018-05-11 NOTE — ED Triage Notes (Signed)
Pt presents to ED with complaints of chest congestion, nasal congestion for 2-3 days. Pt states clear, white nasal drainage.

## 2018-05-11 NOTE — Discharge Instructions (Addendum)
Your blood pressure slightly elevated, but the remainder of your vital signs within normal limits.  Your examination favors an upper respiratory infection.  Your chest x-ray is negative for acute pneumonia or other acute problems.  Your oxygen level is 100% on room air.  Please use Claritin-D or Allegra-D for congestion.  Please increase fluids.  Wash hands frequently.  Use Tylenol every 4 hours, or ibuprofen every 6 hours for fever, aching, or chills.  Please see Ms. Jean Rosenthal or return to the emergency department if any changes in your condition, problems, or concerns.

## 2018-05-11 NOTE — ED Provider Notes (Signed)
[facility] EMERGENCY DEPARTMENT Provider Note   CSN: [id number] Arrival date & time: 05/11/18  1543     History   Chief Complaint Chief Complaint  Patient presents with  . Chest congestion    HPI Kyle Gill is a 37 y.o. male.  Patient states he has a tendency to bronchitis and upper respiratory infections.  He also had a relative who was recently diagnosed with meningitis, and he was concerned about the possibility of him having pneumonia given that he is HIV positive.  The history is provided by the patient.  URI   This is a new problem. The current episode started more than 2 days ago. There has been no fever. Associated symptoms include chest pain, congestion, sinus pain, sneezing and cough. Pertinent negatives include no abdominal pain, no dysuria, no neck pain and no wheezing. He has tried other medications (OTC cold medications.) for the symptoms. The treatment provided no relief.    Past Medical History:  Diagnosis Date  . Bell's palsy   . HIV positive (HCC)   . Hypertension   . Pneumonia     Patient Active Problem List   Diagnosis Date Noted  . Bankart lesion of right shoulder 09/12/2016  . HIV DISEASE 03/10/2007  . DEPRESSION 03/10/2007  . VISUAL CHANGES 03/10/2007    Past Surgical History:  Procedure Laterality Date  . SHOULDER ARTHROSCOPY WITH LABRAL REPAIR Right 06/13/2016   Procedure: SHOULDER DIAGNOSTIC OPERATIVE ARTHROSCOPY WITH LABRAL REPAIR;  Surgeon: Cammy Copa, MD;  Location: [facility] OR;  Service: Orthopedics;  Laterality: Right;        Home Medications    Prior to Admission medications   Medication Sig Start Date End Date Taking? Authorizing Provider  gabapentin (NEURONTIN) 300 MG capsule Take 1 capsule (300 mg total) by mouth at bedtime. 12/11/17   Vickki Hearing, MD    Family History No family history on file.  Social History Social History   Tobacco Use  . Smoking status: Current Every Day Smoker    Packs/day: 0.50   Types: Cigarettes  . Smokeless tobacco: Never Used  Substance Use Topics  . Alcohol use: Yes    Comment: occasionally  . Drug use: Yes    Types: Marijuana     Allergies   Aspirin; Penicillins; Sulfamethoxazole-trimethoprim; and Chlorhexidine   Review of Systems Review of Systems  Constitutional: Negative for activity change.       All ROS Neg except as noted in HPI  HENT: Positive for congestion, sinus pain and sneezing. Negative for nosebleeds.   Eyes: Negative for photophobia and discharge.  Respiratory: Positive for cough. Negative for shortness of breath and wheezing.   Cardiovascular: Positive for chest pain. Negative for palpitations.  Gastrointestinal: Negative for abdominal pain and blood in stool.  Genitourinary: Negative for dysuria, frequency and hematuria.  Musculoskeletal: Negative for arthralgias, back pain and neck pain.  Skin: Negative.   Neurological: Negative for dizziness, seizures and speech difficulty.  Psychiatric/Behavioral: Negative for confusion and hallucinations.     Physical Exam Updated Vital Signs BP (!) 154/95 (BP Location: Right Arm)   Pulse 65   Temp 98.5 F (36.9 C) (Oral)   Resp 18   Ht 6\' 3"  (1.905 m)   Wt 99.8 kg   SpO2 100%   BMI 27.50 kg/m   Physical Exam  Constitutional: He is oriented to person, place, and time. He appears well-developed and well-nourished.  Non-toxic appearance.  HENT:  Head: Normocephalic.  Right Ear: Tympanic membrane  and external ear normal.  Left Ear: Tympanic membrane and external ear normal.  Geographic tongue  Eyes: Pupils are equal, round, and reactive to light. EOM and lids are normal.  Neck: Normal range of motion. Neck supple. Carotid bruit is not present.  Cardiovascular: Normal rate, regular rhythm, normal heart sounds, intact distal pulses and normal pulses.  Pulmonary/Chest: Breath sounds normal. No respiratory distress.  There is symmetrical rise and fall of the chest.  Patient speaks in  complete sentences without problem.  Abdominal: Soft. Bowel sounds are normal. There is no tenderness. There is no guarding.  Musculoskeletal: Normal range of motion.  Capillary refill is less than 2 seconds.  There is no swelling or edema of the upper or lower extremities.  Lymphadenopathy:       Head (right side): No submandibular adenopathy present.       Head (left side): No submandibular adenopathy present.    He has no cervical adenopathy.  Neurological: He is alert and oriented to person, place, and time. He has normal strength. No cranial nerve deficit or sensory deficit.  Skin: Skin is warm and dry.  Psychiatric: He has a normal mood and affect. His speech is normal.  Nursing note and vitals reviewed.    ED Treatments / Results  Labs (all labs ordered are listed, but only abnormal results are displayed) Labs Reviewed - No data to display  EKG None  Radiology Dg Chest 2 View  Result Date: 05/11/2018 CLINICAL DATA:  Cough and shortness of breath; chest tightness EXAM: CHEST - 2 VIEW COMPARISON:  October 06, 2017 FINDINGS: There is slight left base atelectasis. There is no edema or consolidation. The heart size and pulmonary vascularity are normal. No adenopathy. No pneumothorax. No bone lesions. IMPRESSION: No edema or consolidation.  There is slight left base atelectasis. Electronically Signed   By: Bretta Bang III M.D.   On: 05/11/2018 16:24    Procedures Procedures (including critical care time)  Medications Ordered in ED Medications - No data to display   Initial Impression / Assessment and Plan / ED Course  I have reviewed the triage vital signs and the nursing notes.  Pertinent labs & imaging results that were available during my care of the patient were reviewed by me and considered in my medical decision making (see chart for details).       Final Clinical Impressions(s) / ED Diagnoses MDM  Blood pressure is slightly elevated at 154/95, otherwise  vital signs are within normal limits.  Pulse oximetry is 100% on room air.  Within normal limits by my interpretation.  Patient speaks in complete sentences without problem.  Patient is in no distress at this time.  Chest x-ray shows some mild atelectasis, but no consolidation, no other acute problems.  I have advised the patient of the findings on the examination, as well as the findings on the x-ray.  Have asked the patient to increase fluids, wash hands frequently.  To use Claritin-D or Allegra-D for congestion and cough.  To use Tylenol extra strength or ibuprofen for soreness.  To see the primary physician or return to the emergency department if any changes in his condition, problems, or concerns.   Final diagnoses:  Upper respiratory tract infection, unspecified type    ED Discharge Orders    None       Ivery Quale, PA-C 05/11/18 1709    Bethann Berkshire, MD 05/12/18 2330

## 2019-03-12 ENCOUNTER — Emergency Department (HOSPITAL_COMMUNITY)
Admission: EM | Admit: 2019-03-12 | Discharge: 2019-03-12 | Disposition: A | Discharge status: Home / Self Care | Payer: BC Managed Care – PPO | Attending: Emergency Medicine | Admitting: Emergency Medicine

## 2019-03-12 ENCOUNTER — Encounter (HOSPITAL_COMMUNITY): Payer: Self-pay | Admitting: Emergency Medicine

## 2019-03-12 ENCOUNTER — Other Ambulatory Visit: Payer: Self-pay

## 2019-03-12 DIAGNOSIS — Z79899 Other long term (current) drug therapy: Secondary | ICD-10-CM | POA: Diagnosis not present

## 2019-03-12 DIAGNOSIS — B029 Zoster without complications: Secondary | ICD-10-CM | POA: Insufficient documentation

## 2019-03-12 DIAGNOSIS — Z21 Asymptomatic human immunodeficiency virus [HIV] infection status: Secondary | ICD-10-CM | POA: Insufficient documentation

## 2019-03-12 DIAGNOSIS — I10 Essential (primary) hypertension: Secondary | ICD-10-CM | POA: Diagnosis not present

## 2019-03-12 DIAGNOSIS — R21 Rash and other nonspecific skin eruption: Secondary | ICD-10-CM | POA: Diagnosis present

## 2019-03-12 DIAGNOSIS — F17210 Nicotine dependence, cigarettes, uncomplicated: Secondary | ICD-10-CM | POA: Diagnosis not present

## 2019-03-12 LAB — T-HELPER CELLS (CD4) COUNT (NOT AT ARMC)
CD4 % Helper T Cell: 15 % — ABNORMAL LOW (ref 33–65)
CD4 T Cell Abs: 333 /uL — ABNORMAL LOW (ref 400–1790)

## 2019-03-12 MED ORDER — HYDROCODONE-ACETAMINOPHEN 5-325 MG PO TABS
1.0000 | ORAL_TABLET | Freq: Once | ORAL | Status: AC
  Administered 2019-03-12: 1 via ORAL
  Filled 2019-03-12: qty 1

## 2019-03-12 MED ORDER — HYDROCODONE-ACETAMINOPHEN 5-325 MG PO TABS: 1.0000 | ORAL_TABLET | ORAL | 0 refills | Status: DC | PRN

## 2019-03-12 MED ORDER — VALACYCLOVIR HCL 1 G PO TABS: 1000.0000 mg | ORAL_TABLET | Freq: Three times a day (TID) | ORAL | 0 refills | Status: AC

## 2019-03-12 NOTE — ED Provider Notes (Signed)
[facility] EMERGENCY DEPARTMENT Provider Note   CSN: [id number] Arrival date & time: 03/12/19  1146     History   Chief Complaint Chief Complaint  Patient presents with  . Rash    HPI Kyle Gill is a 38 y.o. male who presents with a rash.  Past medical history significant for HIV currently not taking ART.  Patient states that he developed right shoulder pain over the past couple of days.  He had surgery on his right shoulder three years ago and therefore was concerned it may be infected.  Over the past couple of days he has developed a rash that is painful and itchy that extends from his shoulder to his hand.  There are associated vesicles.  He has had chickenpox as a child.  He denies any contact with any poison ivy. Nothing makes it better or worse. He has not had his CD4 count done since Feb 2019 which was 330.      HPI  Past Medical History:  Diagnosis Date  . Bell's palsy   . HIV positive (HCC)   . Hypertension   . Pneumonia     Patient Active Problem List   Diagnosis Date Noted  . Bankart lesion of right shoulder 09/12/2016  . HIV DISEASE 03/10/2007  . DEPRESSION 03/10/2007  . VISUAL CHANGES 03/10/2007    Past Surgical History:  Procedure Laterality Date  . SHOULDER ARTHROSCOPY WITH LABRAL REPAIR Right 06/13/2016   Procedure: SHOULDER DIAGNOSTIC OPERATIVE ARTHROSCOPY WITH LABRAL REPAIR;  Surgeon: Cammy CopaScott Gregory Dean, MD;  Location: [facility] OR;  Service: Orthopedics;  Laterality: Right;        Home Medications    Prior to Admission medications   Medication Sig Start Date End Date Taking? Authorizing Provider  gabapentin (NEURONTIN) 300 MG capsule Take 1 capsule (300 mg total) by mouth at bedtime. 12/11/17   Vickki HearingHarrison, Stanley E, MD    Family History No family history on file.  Social History Social History   Tobacco Use  . Smoking status: Current Every Day Smoker    Packs/day: 0.50    Types: Cigarettes  . Smokeless tobacco: Never Used  Substance Use  Topics  . Alcohol use: Yes    Comment: occasionally  . Drug use: Yes    Types: Marijuana     Allergies   Aspirin, Penicillins, Sulfamethoxazole-trimethoprim, and Chlorhexidine   Review of Systems Review of Systems  Constitutional: Negative for fever.  Musculoskeletal: Positive for arthralgias.  Skin: Positive for rash.  Allergic/Immunologic: Positive for immunocompromised state.  Psychiatric/Behavioral: Negative for confusion.  All other systems reviewed and are negative.    Physical Exam Updated Vital Signs Ht 6' 3.5" (1.918 m)   Wt 99.8 kg   BMI 27.14 kg/m   Physical Exam Vitals signs and nursing note reviewed.  Constitutional:      General: He is not in acute distress.    Appearance: Normal appearance. He is well-developed. He is not ill-appearing.     Comments: Calm, cooperative. Well appearing  HENT:     Head: Normocephalic and atraumatic.  Eyes:     General: No scleral icterus.       Right eye: No discharge.        Left eye: No discharge.     Conjunctiva/sclera: Conjunctivae normal.     Pupils: Pupils are equal, round, and reactive to light.  Neck:     Musculoskeletal: Normal range of motion.  Cardiovascular:     Rate and Rhythm: Normal rate.  Pulmonary:  Effort: Pulmonary effort is normal. No respiratory distress.  Abdominal:     General: There is no distension.  Skin:    General: Skin is warm and dry.     Findings: Rash (Grouped, erythematous rash with vesicles that extend from the anterior shoulder to the anterior wrist. Rash is in the C6 dermatome) present.  Neurological:     Mental Status: He is alert and oriented to person, place, and time.  Psychiatric:        Mood and Affect: Mood normal.        Behavior: Behavior normal.          ED Treatments / Results  Labs (all labs ordered are listed, but only abnormal results are displayed) Labs Reviewed - No data to display  EKG None  Radiology No results found.  Procedures  Procedures (including critical care time)  Medications Ordered in ED Medications - No data to display   Initial Impression / Assessment and Plan / ED Course  I have reviewed the triage vital signs and the nursing notes.  Pertinent labs & imaging results that were available during my care of the patient were reviewed by me and considered in my medical decision making (see chart for details).  38 year old male with untreated HIV presents with a painful vesicular rash in the C6 dermatome over the past couple days.  He is mildly hypertensive but otherwise vital signs are normal.  He is alert and oriented.  His last CD4 count was 330 over a year ago.  He has not been on any ART.  He states that he does not believe in taking medicine for his HIV disease.  Will consult infectious disease regarding treatment and follow-up.  Spoke with Dr. Megan Salon with infectious disease.  He advises treating with oral antivirals at this time and to redraw his CD4 counts and viral load counts.  Discussed this with the patient who is agreeable.  He was given follow-up with infectious disease.  Final Clinical Impressions(s) / ED Diagnoses   Final diagnoses:  Herpes zoster without complication    ED Discharge Orders    None       [name], PA-C 03/12/19 [place], [place], DO 03/16/19 1646

## 2019-03-12 NOTE — ED Triage Notes (Signed)
Pt c/o rash to RT arm. Weeping vesicles noted on some areas of the rash. Denies fever.

## 2019-03-12 NOTE — Discharge Instructions (Addendum)
Take Valtrex 1000mg  three times daily for one week Take Norco as directed for moderate-severe pain  Please follow up with Dr. Megan Salon (infectious disease) Return if worsening

## 2019-03-16 ENCOUNTER — Telehealth: Payer: Self-pay

## 2019-03-16 LAB — HIV-1 RNA QUANT-NO REFLEX-BLD
HIV 1 RNA Quant: 930 copies/mL
LOG10 HIV-1 RNA: 2.968 log10copy/mL

## 2019-03-16 NOTE — Telephone Encounter (Signed)
Much thanks.

## 2019-03-16 NOTE — Telephone Encounter (Signed)
Patient aggred to schedule appointment with Dr. Megan Salon. CD4 and VL obtained during last hospitalization. Patient states he may not agree to take any medication but he is willing to discuss his labs with the provider.  Eugenia Mcalpine, LPN

## 2019-03-16 NOTE — Telephone Encounter (Signed)
-----   Message from Michel Bickers, MD sent at 03/12/2019  1:05 PM EDT ----- Please reach out to Mr. Begley and see if he will schedule a clinic visit.  He is in the [facility], ED with shingles.  He has known HIV infection but appears to have been out of care since 2009.

## 2019-03-24 DIAGNOSIS — F17210 Nicotine dependence, cigarettes, uncomplicated: Secondary | ICD-10-CM | POA: Insufficient documentation

## 2019-03-25 ENCOUNTER — Ambulatory Visit: Payer: BC Managed Care – PPO | Admitting: Internal Medicine

## 2019-03-25 ENCOUNTER — Telehealth: Payer: Self-pay | Admitting: Pharmacy Technician

## 2019-03-25 NOTE — Telephone Encounter (Signed)
[facility] Patient Advocate Encounter    Findings of the benefits investigation conducted this morning via test claims for the patient's upcoming appointment on 03/25/19 are as follows:   Insurance: [employer]- active commercial  Must be filled through [place] [phone]  [facility] Patient Advocate will follow up once patient arrives for their appointment if any further assistance is needed.  Bartholomew Crews, CPhT Specialty Pharmacy Patient [facility] Phone: [phone] Fax: [phone] 03/25/2019 8:29 AM

## 2019-03-30 ENCOUNTER — Ambulatory Visit: Payer: BC Managed Care – PPO | Admitting: Internal Medicine

## 2019-07-08 ENCOUNTER — Other Ambulatory Visit: Payer: Self-pay

## 2019-07-08 ENCOUNTER — Emergency Department (HOSPITAL_COMMUNITY)
Admission: EM | Admit: 2019-07-08 | Discharge: 2019-07-08 | Disposition: A | Discharge status: Home / Self Care | Payer: BC Managed Care – PPO | Attending: Emergency Medicine | Admitting: Emergency Medicine

## 2019-07-08 ENCOUNTER — Encounter (HOSPITAL_COMMUNITY): Payer: Self-pay | Admitting: Emergency Medicine

## 2019-07-08 DIAGNOSIS — F17210 Nicotine dependence, cigarettes, uncomplicated: Secondary | ICD-10-CM | POA: Diagnosis not present

## 2019-07-08 DIAGNOSIS — S80861A Insect bite (nonvenomous), right lower leg, initial encounter: Secondary | ICD-10-CM | POA: Insufficient documentation

## 2019-07-08 DIAGNOSIS — W57XXXA Bitten or stung by nonvenomous insect and other nonvenomous arthropods, initial encounter: Secondary | ICD-10-CM | POA: Diagnosis not present

## 2019-07-08 DIAGNOSIS — Z21 Asymptomatic human immunodeficiency virus [HIV] infection status: Secondary | ICD-10-CM | POA: Insufficient documentation

## 2019-07-08 DIAGNOSIS — Y999 Unspecified external cause status: Secondary | ICD-10-CM | POA: Diagnosis not present

## 2019-07-08 DIAGNOSIS — Z79899 Other long term (current) drug therapy: Secondary | ICD-10-CM | POA: Insufficient documentation

## 2019-07-08 DIAGNOSIS — Y9389 Activity, other specified: Secondary | ICD-10-CM | POA: Diagnosis not present

## 2019-07-08 DIAGNOSIS — I10 Essential (primary) hypertension: Secondary | ICD-10-CM | POA: Diagnosis not present

## 2019-07-08 DIAGNOSIS — Y92007 Garden or yard of unspecified non-institutional (private) residence as the place of occurrence of the external cause: Secondary | ICD-10-CM | POA: Insufficient documentation

## 2019-07-08 MED ORDER — DOXYCYCLINE HYCLATE 100 MG PO CAPS: 100.0000 mg | ORAL_CAPSULE | Freq: Two times a day (BID) | ORAL | 0 refills | Status: DC

## 2019-07-08 MED ORDER — DOXYCYCLINE HYCLATE 100 MG PO TABS
100.0000 mg | ORAL_TABLET | Freq: Once | ORAL | Status: AC
  Administered 2019-07-08: 100 mg via ORAL
  Filled 2019-07-08: qty 1

## 2019-07-08 NOTE — Discharge Instructions (Signed)
Warm water soaks t to your lower leg, take the antibiotic as directed until its finished.  You may apply 1% over-the-counter hydrocortisone cream 3 times a day as needed for itching.  You may also take over-the-counter Benadryl if the itching is severe.  Your blood pressure this evening is elevated, you will need to take your blood pressure medication when you get home.  Follow-up with your primary provider for recheck or return to the ER for any worsening symptoms.

## 2019-07-08 NOTE — ED Provider Notes (Signed)
[facility] EMERGENCY DEPARTMENT Provider Note   CSN: [id number] Arrival date & time: 07/08/19  1716     History   Chief Complaint Chief Complaint  Patient presents with  . Insect Bite    HPI Kyle Gill is a 38 y.o. male.     HPI   Kyle Gill is a 38 y.o. male who presents to the Emergency Department complaining of pain itching and redness to the area of the right calf.  He states that he has been walking in the grass to feed his dogs and he believes that he may have been bitten by insect.  He states he noticed 2 small "bumps" to his calf yesterday and woke up this morning with redness surrounding the affected area.  He describes having aching pain associated with walking or standing.  He admits to scratching at the area.  He denies fever, chills, red streaks, drainage from the area or pain radiating into his thigh.    Past Medical History:  Diagnosis Date  . Bell's palsy   . HIV positive ([place])   . Hypertension   . Pneumonia     Patient Active Problem List   Diagnosis Date Noted  . Cigarette smoker 03/24/2019  . Bankart lesion of right shoulder 09/12/2016  . HIV DISEASE 03/10/2007  . DEPRESSION 03/10/2007  . VISUAL CHANGES 03/10/2007    Past Surgical History:  Procedure Laterality Date  . SHOULDER ARTHROSCOPY WITH LABRAL REPAIR Right 06/13/2016   Procedure: SHOULDER DIAGNOSTIC OPERATIVE ARTHROSCOPY WITH LABRAL REPAIR;  Surgeon: Meredith Pel, MD;  Location: [place];  Service: Orthopedics;  Laterality: Right;        Home Medications    Prior to Admission medications   Medication Sig Start Date End Date Taking? Authorizing Provider  gabapentin (NEURONTIN) 300 MG capsule Take 1 capsule (300 mg total) by mouth at bedtime. 12/11/17   Carole Civil, MD  HYDROcodone-acetaminophen (NORCO/VICODIN) 5-325 MG tablet Take 1 tablet by mouth every 4 (four) hours as needed. 03/12/19   [name], PA-C    Family History No family history on file.   Social History Social History   Tobacco Use  . Smoking status: Current Every Day Smoker    Packs/day: 0.50    Types: Cigarettes  . Smokeless tobacco: Never Used  Substance Use Topics  . Alcohol use: Yes    Comment: occasionally  . Drug use: Yes    Types: Marijuana     Allergies   Aspirin, Penicillins, Sulfamethoxazole-trimethoprim, and Chlorhexidine   Review of Systems Review of Systems  Constitutional: Negative for chills and fever.  Respiratory: Negative for cough and shortness of breath.   Gastrointestinal: Negative for nausea and vomiting.  Musculoskeletal: Negative for arthralgias and joint swelling.  Skin: Positive for color change. Negative for rash.       Redness, itching and 2 "bumps" to posterior right lower leg  Neurological: Negative for weakness and numbness.  Hematological: Negative for adenopathy.     Physical Exam Updated Vital Signs BP (!) 148/105 (BP Location: Right Arm)   Pulse 91   Temp 98 F (36.7 C) (Oral)   Resp 16   Ht 6' 3.5" (1.918 m)   Wt 99.8 kg   SpO2 97%   BMI 27.14 kg/m   Physical Exam Vitals signs and nursing note reviewed.  Constitutional:      General: He is not in acute distress.    Appearance: Normal appearance.  HENT:     Head: Normocephalic.  Cardiovascular:     Rate and Rhythm: Normal rate and regular rhythm.     Pulses: Normal pulses.  Pulmonary:     Effort: Pulmonary effort is normal.  Musculoskeletal: Normal range of motion.        General: No swelling, deformity or signs of injury.  Skin:    General: Skin is warm.     Capillary Refill: Capillary refill takes less than 2 seconds.     Findings: Erythema present.     Comments: 2 small pea-sized pustules to the posterior right lower leg.  There is a small amount of surrounding erythema present.  No induration or fluctuance.  No lymphangitis.  Neurological:     General: No focal deficit present.      ED Treatments / Results  Labs (all labs ordered are  listed, but only abnormal results are displayed) Labs Reviewed - No data to display  EKG None  Radiology No results found.  Procedures Procedures (including critical care time)  Medications Ordered in ED Medications  doxycycline (VIBRA-TABS) tablet 100 mg (has no administration in time range)     Initial Impression / Assessment and Plan / ED Course  I have reviewed the triage vital signs and the nursing notes.  Pertinent labs & imaging results that were available during my care of the patient were reviewed by me and considered in my medical decision making (see chart for details).        Patient with 2 small less than 1 cm pustules to the posterior right lower leg.  Appears consistent with insect bite.  No concerning symptoms for abscess.  There is no indication for I&D at this time.  There is a small amount of surrounding erythema that may indicate early developing cellulitis.  He is ambulatory, neurovascularly intact.  Leading edge of erythema was marked by me.  Patient agrees to warm compresses elevation and close observation.  Return precautions were discussed.  Pt noted to be hypertensive, he admits to not taking his lisinopril x 2 days.  Denies symptoms other than presenting complaint.  Counseled on importance of maintaining adequate blood pressure control.      Final Clinical Impressions(s) / ED Diagnoses   Final diagnoses:  Insect bite of right lower leg, initial encounter  Hypertension, unspecified type    ED Discharge Orders    None       Pauline Aus, Cordelia Poche 07/08/19 2039    Geoffery Lyons, MD 07/08/19 2303

## 2019-07-08 NOTE — ED Triage Notes (Signed)
Pt states that he thinks he got bitten by a spider on the back of his legs

## 2019-11-19 ENCOUNTER — Other Ambulatory Visit: Payer: Self-pay

## 2019-11-19 ENCOUNTER — Ambulatory Visit: Payer: Self-pay | Attending: Internal Medicine

## 2019-11-19 DIAGNOSIS — Z20822 Contact with and (suspected) exposure to covid-19: Secondary | ICD-10-CM

## 2019-11-22 ENCOUNTER — Telehealth: Payer: Self-pay

## 2019-11-22 NOTE — Telephone Encounter (Signed)
Pt. Called to obtain COVID test results.  Advised that results fro 3/19 test are still pending.  Pt. Verb. Understanding.

## 2019-11-22 NOTE — Telephone Encounter (Signed)
Patient is calling to check result of COVID- notified still pending.

## 2019-11-22 NOTE — Telephone Encounter (Signed)
Pt. Checking on COVID 19 results, not available yet. °

## 2019-11-24 ENCOUNTER — Ambulatory Visit: Payer: BC Managed Care – PPO | Attending: Internal Medicine

## 2019-11-24 ENCOUNTER — Other Ambulatory Visit: Payer: Self-pay

## 2019-11-24 ENCOUNTER — Telehealth: Payer: Self-pay

## 2019-11-24 DIAGNOSIS — Z20822 Contact with and (suspected) exposure to covid-19: Secondary | ICD-10-CM

## 2019-11-24 LAB — NOVEL CORONAVIRUS, NAA: SARS-CoV-2, NAA: NOT DETECTED

## 2019-11-24 NOTE — Telephone Encounter (Signed)
Pt. Checking on COVID 19 results, not available yet. °

## 2019-11-24 NOTE — Telephone Encounter (Signed)
Patient is calling about lab result- [facility] does not have them- patient given order number to help better track order. He will call back if he needs to be retested.

## 2019-11-25 LAB — NOVEL CORONAVIRUS, NAA: SARS-CoV-2, NAA: DETECTED — AB

## 2019-11-25 LAB — SARS-COV-2, NAA 2 DAY TAT

## 2019-11-26 ENCOUNTER — Telehealth: Payer: Self-pay | Admitting: Physician Assistant

## 2019-11-26 NOTE — Telephone Encounter (Signed)
Called to discuss with Rosario Adie about Covid symptoms and the use of bamlanivimab or casirivimab/imdevimab, a monoclonal antibody infusion for those with mild to moderate Covid symptoms and at a high risk of hospitalization.     Pt is qualified for this infusion at the [facility] infusion center due to co-morbid conditions and/or a member of an at-risk group (HIV+), however he would like to think more about it at this time. Symptoms tier reviewed as well as criteria for ending isolation.  Symptoms reviewed that would warrant ED/Hospital evaluation. Preventative practices reviewed. Patient verbalized understanding. Patient advised to call back if he decides that he does want to get infusion. Callback number to the infusion center given. Patient advised to go to Urgent care or ED with severe symptoms. Last date pt would be eligible for infusion is 11/30/19 .    Patient Active Problem List   Diagnosis Date Noted  . Cigarette smoker 03/24/2019  . Bankart lesion of right shoulder 09/12/2016  . HIV DISEASE 03/10/2007  . DEPRESSION 03/10/2007  . VISUAL CHANGES 03/10/2007    Cline Crock PA-C

## 2019-12-30 ENCOUNTER — Emergency Department (HOSPITAL_COMMUNITY): Payer: BC Managed Care – PPO

## 2019-12-30 ENCOUNTER — Other Ambulatory Visit: Payer: Self-pay

## 2019-12-30 ENCOUNTER — Encounter (HOSPITAL_COMMUNITY): Payer: Self-pay | Admitting: *Deleted

## 2019-12-30 ENCOUNTER — Emergency Department (HOSPITAL_COMMUNITY)
Admission: EM | Admit: 2019-12-30 | Discharge: 2019-12-30 | Disposition: A | Discharge status: Home / Self Care | Payer: BC Managed Care – PPO | Attending: Emergency Medicine | Admitting: Emergency Medicine

## 2019-12-30 DIAGNOSIS — L03011 Cellulitis of right finger: Secondary | ICD-10-CM | POA: Insufficient documentation

## 2019-12-30 DIAGNOSIS — Z21 Asymptomatic human immunodeficiency virus [HIV] infection status: Secondary | ICD-10-CM | POA: Insufficient documentation

## 2019-12-30 DIAGNOSIS — F17210 Nicotine dependence, cigarettes, uncomplicated: Secondary | ICD-10-CM | POA: Diagnosis not present

## 2019-12-30 DIAGNOSIS — I10 Essential (primary) hypertension: Secondary | ICD-10-CM | POA: Insufficient documentation

## 2019-12-30 DIAGNOSIS — M79644 Pain in right finger(s): Secondary | ICD-10-CM | POA: Diagnosis present

## 2019-12-30 DIAGNOSIS — Z79899 Other long term (current) drug therapy: Secondary | ICD-10-CM | POA: Insufficient documentation

## 2019-12-30 MED ORDER — LIDOCAINE HCL (PF) 2 % IJ SOLN
INTRAMUSCULAR | Status: AC
  Filled 2019-12-30: qty 10

## 2019-12-30 MED ORDER — LIDOCAINE HCL (PF) 2 % IJ SOLN
10.0000 mL | Freq: Once | INTRAMUSCULAR | Status: AC
  Administered 2019-12-30: 15:00:00 10 mL via INTRADERMAL

## 2019-12-30 MED ORDER — DOXYCYCLINE HYCLATE 100 MG PO CAPS
100.0000 mg | ORAL_CAPSULE | Freq: Two times a day (BID) | ORAL | 0 refills | Status: DC
Start: 2019-12-30 — End: 2019-12-30

## 2019-12-30 MED ORDER — DOXYCYCLINE HYCLATE 100 MG PO CAPS
100.0000 mg | ORAL_CAPSULE | Freq: Two times a day (BID) | ORAL | 0 refills | Status: AC
Start: 2019-12-30 — End: 2020-01-06

## 2019-12-30 NOTE — ED Provider Notes (Signed)
[facility] EMERGENCY DEPARTMENT Provider Note   CSN: [id number] Arrival date & time: 12/30/19  1259     History Chief Complaint  Patient presents with  . Hand Pain    Kyle Gill is a 39 y.o. male.  HPI   39 year old male with a history of Bell's palsy, HIV, hypertension, pneumonia, who presents to the emergency department today for evaluation of right middle finger pain.  States he had a hangnail earlier this week that got infected.  He now has pain and swelling to his right middle finger.  No significant pain to the palmar aspect of the finger.  Pain does radiate up his arm and he feels a swollen area just proximal to his elbow on the medial side.  He is not had any fevers.  Pain is constant in nature and worse with palpation.  Past Medical History:  Diagnosis Date  . Bell's palsy   . HIV positive ([facility])   . Hypertension   . Pneumonia     Patient Active Problem List   Diagnosis Date Noted  . Cigarette smoker 03/24/2019  . Bankart lesion of right shoulder 09/12/2016  . HIV DISEASE 03/10/2007  . DEPRESSION 03/10/2007  . VISUAL CHANGES 03/10/2007    Past Surgical History:  Procedure Laterality Date  . SHOULDER ARTHROSCOPY WITH LABRAL REPAIR Right 06/13/2016   Procedure: SHOULDER DIAGNOSTIC OPERATIVE ARTHROSCOPY WITH LABRAL REPAIR;  Surgeon: Cammy Copa, MD;  Location: [facility];  Service: Orthopedics;  Laterality: Right;       History reviewed. No pertinent family history.  Social History   Tobacco Use  . Smoking status: Current Every Day Smoker    Packs/day: 0.50    Types: Cigarettes  . Smokeless tobacco: Never Used  Substance Use Topics  . Alcohol use: Yes    Comment: occasionally  . Drug use: Yes    Types: Marijuana    Home Medications Prior to Admission medications   Medication Sig Start Date End Date Taking? Authorizing Provider  doxycycline (VIBRAMYCIN) 100 MG capsule Take 1 capsule (100 mg total) by mouth 2 (two) times daily. Patient not  taking: Reported on 12/30/2019 07/08/19   Triplett, Tammy, PA-C  gabapentin (NEURONTIN) 300 MG capsule Take 1 capsule (300 mg total) by mouth at bedtime. Patient not taking: Reported on 12/30/2019 12/11/17   Vickki Hearing, MD  HYDROcodone-acetaminophen (NORCO/VICODIN) 5-325 MG tablet Take 1 tablet by mouth every 4 (four) hours as needed. Patient not taking: Reported on 12/30/2019 03/12/19   Bethel Born, PA-C  lisinopril-hydrochlorothiazide (ZESTORETIC) 10-12.5 MG tablet Take 1 tablet by mouth daily. 10/18/19   [provider]    Allergies    Aspirin, Penicillins, Sulfamethoxazole-trimethoprim, and Chlorhexidine  Review of Systems   Review of Systems  Constitutional: Negative for fever.  Musculoskeletal:       Right middle finger pain    Physical Exam Updated Vital Signs BP 139/90 (BP Location: Left Arm)   Pulse 68   Temp 98.5 F (36.9 C) (Oral)   Resp 18   Ht 6' 3.5" (1.918 m)   Wt 104.3 kg   SpO2 100%   BMI 28.37 kg/m   Physical Exam Constitutional:      General: He is not in acute distress.    Appearance: He is well-developed.  Eyes:     Conjunctiva/sclera: Conjunctivae normal.  Cardiovascular:     Rate and Rhythm: Normal rate.  Pulmonary:     Effort: Pulmonary effort is normal.  Musculoskeletal:  Comments: Swelling, erythema, TTP noted to the distal middle finger on the right hand. Small area of fluctuance with what appears to be pus noted adjacent to the nailbed. No fluctuance or obvious abscess to the fingerpad  Lymphadenopathy:     Comments: lymphadenopathy noted to the right upper extremity  Skin:    General: Skin is warm and dry.  Neurological:     Mental Status: He is alert and oriented to person, place, and time.     ED Results / Procedures / Treatments   Labs (all labs ordered are listed, but only abnormal results are displayed) Labs Reviewed - No data to display  EKG None  Radiology DG Finger Middle Right  Addendum Date:  12/30/2019   ADDENDUM REPORT: 12/30/2019 14:50 ADDENDUM: Sentence in the impression should read: Localized soft tissue prominence dorsally, immediately proximal to the periungual region. Electronically Signed   By: Bretta Bang III M.D.   On: 12/30/2019 14:50   Result Date: 12/30/2019 CLINICAL DATA:  Pain and redness EXAM: RIGHT THIRD FINGER 2+V COMPARISON:  None. FINDINGS: Frontal, oblique, and lateral views were obtained. There is soft tissue prominence immediately proximal to the periungual region dorsally. No associated radiopaque foreign body or soft tissue air. No fracture or dislocation. Joint spaces appear normal. No erosive change or bony destruction. IMPRESSION: Localized soft tissue prominence dorsally, immediately proximal to the perianal region. No associated air or radiopaque foreign body. No bony abnormality.  No arthropathy. Electronically Signed: By: Bretta Bang III M.D. On: 12/30/2019 14:34    Procedures .Marland KitchenIncision and Drainage  Date/Time: 12/30/2019 3:04 PM Performed by: Karrie Meres, PA-C Authorized by: Karrie Meres, PA-C   Consent:    Consent obtained:  Verbal   Consent given by:  Patient   Risks discussed:  Bleeding, incomplete drainage and pain   Alternatives discussed:  No treatment Location:    Type:  Abscess   Size:  .5 cm   Location: finger. Pre-procedure details:    Procedure prep: alcohol and betadine. Anesthesia (see MAR for exact dosages):    Anesthesia method:  Nerve block   Block needle gauge:  25 G   Block anesthetic:  Lidocaine 2% w/o epi   Block injection procedure:  Anatomic landmarks identified, anatomic landmarks palpated, negative aspiration for blood, introduced needle and incremental injection   Block outcome:  Anesthesia achieved Procedure details:    Incision types:  Stab incision   Incision depth:  Dermal   Scalpel blade:  11   Drainage:  Purulent   Drainage amount:  Scant   Wound treatment:  Wound left open    Packing materials:  None Post-procedure details:    Patient tolerance of procedure:  Tolerated well, no immediate complications   (including critical care time)  Medications Ordered in ED Medications  lidocaine HCl (PF) (XYLOCAINE) 2 % injection 10 mL (has no administration in time range)  lidocaine HCl (PF) (XYLOCAINE) 2 % injection (has no administration in time range)    ED Course  I have reviewed the triage vital signs and the nursing notes.  Pertinent labs & imaging results that were available during my care of the patient were reviewed by me and considered in my medical decision making (see chart for details).    MDM Rules/Calculators/A&P                      39 y/o male presenting with paronichya. Xray right middle finger with no obvious soft tissue gas  or bony abnormality. Patient with skin abscess amenable to incision and drainage.  Abscess was not large enough to warrant packing or drain. Encouraged home warm soaks and flushing.  Mild signs of cellulitis is surrounding skin.  Will d/c to home with abx given pts hx of immunosuppression from hiv. Advised on return precautions.    Final Clinical Impression(s) / ED Diagnoses Final diagnoses:  None    Rx / DC Orders ED Discharge Orders    None       Bishop Dublin 12/30/19 1505    Elnora Morrison, MD 12/31/19 1541

## 2019-12-30 NOTE — ED Triage Notes (Signed)
Pt c/o right middle finger pain x 3 days; pt states he has a lump to the inside of his right upper arm

## 2019-12-30 NOTE — Discharge Instructions (Signed)
You were given a prescription for antibiotics. Please take the antibiotic prescription fully.  ° °Please follow up with your primary care provider within 5-7 days for re-evaluation of your symptoms.  ° °Please return to the emergency room immediately if you experience any new or worsening symptoms or any symptoms that indicate worsening infection such as fevers, increased redness/swelling/pain, warmth, or drainage from the affected area.  ° ° °

## 2020-05-04 ENCOUNTER — Other Ambulatory Visit: Payer: Self-pay

## 2020-05-04 ENCOUNTER — Encounter (HOSPITAL_COMMUNITY): Payer: Self-pay | Admitting: *Deleted

## 2020-05-04 ENCOUNTER — Emergency Department (HOSPITAL_COMMUNITY)
Admission: EM | Admit: 2020-05-04 | Discharge: 2020-05-04 | Disposition: A | Discharge status: Home / Self Care | Payer: BC Managed Care – PPO | Attending: Emergency Medicine | Admitting: Emergency Medicine

## 2020-05-04 DIAGNOSIS — K029 Dental caries, unspecified: Secondary | ICD-10-CM | POA: Insufficient documentation

## 2020-05-04 DIAGNOSIS — F17210 Nicotine dependence, cigarettes, uncomplicated: Secondary | ICD-10-CM | POA: Insufficient documentation

## 2020-05-04 DIAGNOSIS — Z79899 Other long term (current) drug therapy: Secondary | ICD-10-CM | POA: Insufficient documentation

## 2020-05-04 DIAGNOSIS — I10 Essential (primary) hypertension: Secondary | ICD-10-CM

## 2020-05-04 DIAGNOSIS — K0889 Other specified disorders of teeth and supporting structures: Secondary | ICD-10-CM | POA: Diagnosis present

## 2020-05-04 MED ORDER — HYDROCODONE-ACETAMINOPHEN 5-325 MG PO TABS: ORAL_TABLET | ORAL | 0 refills | Status: DC

## 2020-05-04 MED ORDER — CLINDAMYCIN HCL 300 MG PO CAPS
300.0000 mg | ORAL_CAPSULE | Freq: Three times a day (TID) | ORAL | 0 refills | Status: DC
Start: 2020-05-04 — End: 2021-08-28

## 2020-05-04 NOTE — ED Provider Notes (Signed)
[facility] EMERGENCY DEPARTMENT Provider Note   CSN: [id number] Arrival date & time: 05/04/20  1232     History Chief Complaint  Patient presents with  . Dental Pain    Kyle Gill is a 39 y.o. male.  HPI      Kyle Gill is a 39 y.o. male with past medical history for hypertension and HIV who presents to the Emergency Department complaining of right upper dental pain for several days worsening in severity.  He describes a sharp stabbing pain along his right upper molar radiates into his face and toward his right ear.  Pain is worse with chewing.  He contacted his dentist and has a follow-up appointment for 2 weeks.  He has tried over-the-counter pain relievers without improvement.  He denies fever, chills, neck pain, significant facial swelling, difficulty swallowing or breathing.   Past Medical History:  Diagnosis Date  . Bell's palsy   . HIV positive ([facility])   . Hypertension   . Pneumonia     Patient Active Problem List   Diagnosis Date Noted  . Cigarette smoker 03/24/2019  . Bankart lesion of right shoulder 09/12/2016  . HIV DISEASE 03/10/2007  . DEPRESSION 03/10/2007  . VISUAL CHANGES 03/10/2007    Past Surgical History:  Procedure Laterality Date  . SHOULDER ARTHROSCOPY WITH LABRAL REPAIR Right 06/13/2016   Procedure: SHOULDER DIAGNOSTIC OPERATIVE ARTHROSCOPY WITH LABRAL REPAIR;  Surgeon: Cammy Copa, MD;  Location: [facility];  Service: Orthopedics;  Laterality: Right;       No family history on file.  Social History   Tobacco Use  . Smoking status: Current Every Day Smoker    Packs/day: 0.50    Types: Cigarettes  . Smokeless tobacco: Never Used  Vaping Use  . Vaping Use: Never used  Substance Use Topics  . Alcohol use: Yes    Comment: occasionally  . Drug use: Yes    Types: Marijuana    Home Medications Prior to Admission medications   Medication Sig Start Date End Date Taking? Authorizing Provider  gabapentin (NEURONTIN) 300 MG capsule  Take 1 capsule (300 mg total) by mouth at bedtime. Patient not taking: Reported on 12/30/2019 12/11/17   Kyle Hearing, MD  HYDROcodone-acetaminophen (NORCO/VICODIN) 5-325 MG tablet Take 1 tablet by mouth every 4 (four) hours as needed. Patient not taking: Reported on 12/30/2019 03/12/19   Kyle Born, PA-C  lisinopril-hydrochlorothiazide (ZESTORETIC) 10-12.5 MG tablet Take 1 tablet by mouth daily. 10/18/19   [provider]    Allergies    Aspirin, Penicillins, Sulfamethoxazole-trimethoprim, and Chlorhexidine  Review of Systems   Review of Systems  Constitutional: Negative for appetite change and fever.  HENT: Positive for dental problem. Negative for congestion, facial swelling, sore throat and trouble swallowing.   Eyes: Negative for pain and visual disturbance.  Musculoskeletal: Negative for neck pain and neck stiffness.  Neurological: Negative for dizziness, facial asymmetry and headaches.  Hematological: Negative for adenopathy.    Physical Exam Updated Vital Signs BP (!) 143/106 (BP Location: Right Arm)   Pulse 81   Temp 98.4 F (36.9 C) (Oral)   Resp 18   SpO2 98%   Physical Exam Vitals and nursing note reviewed.  Constitutional:      General: He is not in acute distress.    Appearance: He is well-developed. He is not ill-appearing.  HENT:     Head: Normocephalic.     Jaw: No trismus.     Right Ear: Tympanic membrane and ear  canal normal.     Left Ear: Tympanic membrane and ear canal normal.     Mouth/Throat:     Mouth: Mucous membranes are moist.     Dentition: Dental tenderness and dental caries present. No gingival swelling or dental abscesses.     Pharynx: Oropharynx is clear. Uvula midline. No uvula swelling.     Comments: Tender to palpation of the right upper second molar.  Dental caries are present.  No erythema, fluctuance or edema of the surrounding gingiva.  No appreciable facial edema.  No trismus Cardiovascular:     Rate and Rhythm:  Normal rate and regular rhythm.     Pulses: Normal pulses.  Pulmonary:     Effort: Pulmonary effort is normal.     Breath sounds: Normal breath sounds.  Musculoskeletal:        General: Normal range of motion.     Cervical back: Normal range of motion and neck supple.  Lymphadenopathy:     Cervical: No cervical adenopathy.  Skin:    General: Skin is warm.     Findings: No rash.  Neurological:     Mental Status: He is alert.     Sensory: No sensory deficit.     Motor: No weakness or abnormal muscle tone.     ED Results / Procedures / Treatments   Labs (all labs ordered are listed, but only abnormal results are displayed) Labs Reviewed - No data to display  EKG None  Radiology No results found.  Procedures Procedures (including critical care time)  Medications Ordered in ED Medications - No data to display  ED Course  I have reviewed the triage vital signs and the nursing notes.  Pertinent labs & imaging results that were available during my care of the patient were reviewed by me and considered in my medical decision making (see chart for details).    MDM Rules/Calculators/A&P                          Patient here with worsening dental pain for several days.  States he has an upcoming dental appointment in 2 weeks.  On exam, he has tenderness of the right upper molar with dental caries present.  No obvious dental abscess.  No facial edema, trismus, or concerning symptoms for Ludewig's angina.  He is hypertensive here, takes Zestoretic.  No associated symptoms. He appears appropriate for discharge home, advised to have BP rechecked by PCP, agrees to antibiotics and short course of pain medication.   Final Clinical Impression(s) / ED Diagnoses Final diagnoses:  Pain, dental  Hypertension, unspecified type    Rx / DC Orders ED Discharge Orders    None       Kyle Aus, PA-C 05/04/20 1355    Kyle Loll, MD 05/09/20 [phone]

## 2020-05-04 NOTE — Discharge Instructions (Signed)
Your blood pressure today is elevated.  Is important that you take your Zestoretic daily as prescribed.  I recommend that you follow-up with your primary care provider to have your blood pressure rechecked.  Take the antibiotic as directed and be sure to follow-up with your dentist as previously scheduled.

## 2020-05-04 NOTE — ED Triage Notes (Signed)
Dental pain right upper jaw 

## 2020-06-04 IMAGING — DX DG FINGER MIDDLE 2+V*R*
3 series · 3 of 3 positions shown · non-contrast
Comparison: None.
COMPARISON: None.

Addendum:
CLINICAL DATA: Pain and redness

EXAM:
RIGHT THIRD FINGER 2+V

[finger ap]
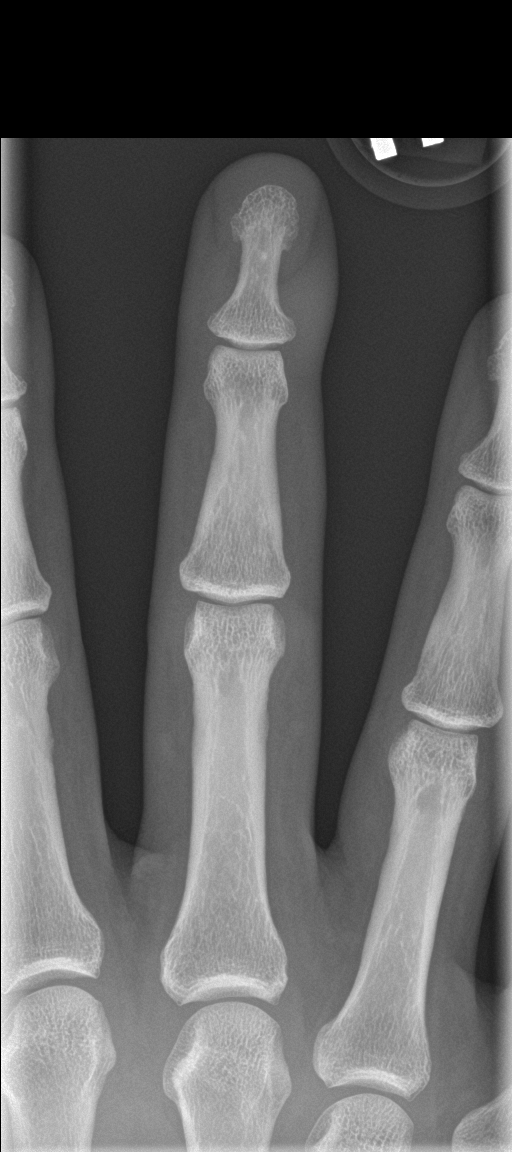

[finger obl]
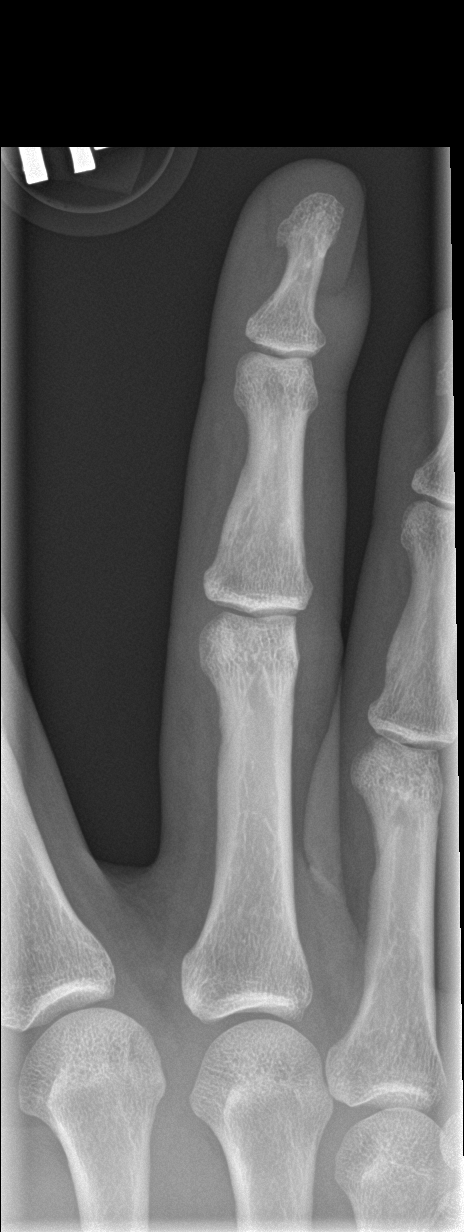

[finger lat]
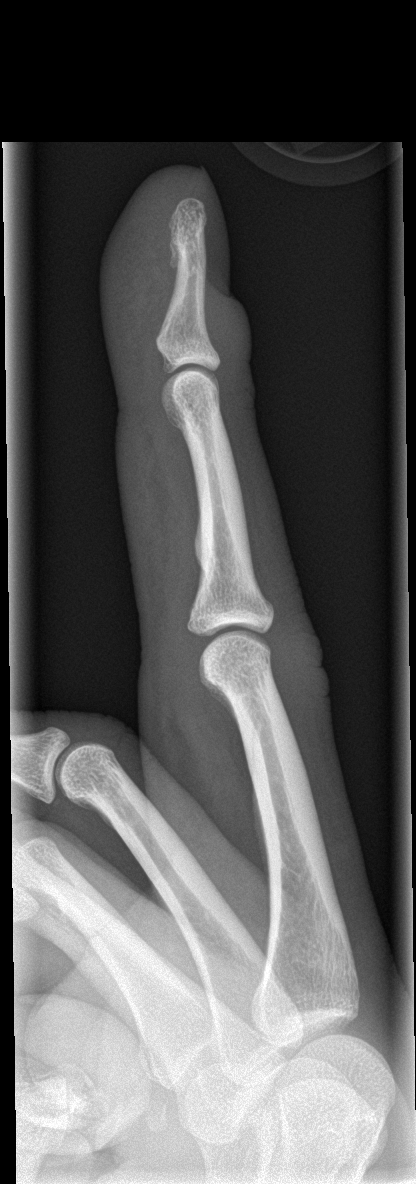

[3 of 3 positions shown; findings below may reference images not displayed]

FINDINGS: Frontal, oblique, and lateral views were obtained. There is soft
tissue prominence immediately proximal to the periungual region
dorsally. No associated radiopaque foreign body or soft tissue air.
No fracture or dislocation. Joint spaces appear normal. No erosive
change or bony destruction.
IMPRESSION: Localized soft tissue prominence dorsally, immediately proximal to
the perianal region. No associated air or radiopaque foreign body.

No bony abnormality.  No arthropathy.

ADDENDUM:
Sentence in the impression should read: Localized soft tissue
prominence dorsally, immediately proximal to the periungual region.

*** End of Addendum ***
FINDINGS: Frontal, oblique, and lateral views were obtained. There is soft
tissue prominence immediately proximal to the periungual region
dorsally. No associated radiopaque foreign body or soft tissue air.
No fracture or dislocation. Joint spaces appear normal. No erosive
change or bony destruction.
IMPRESSION: Localized soft tissue prominence dorsally, immediately proximal to
the perianal region. No associated air or radiopaque foreign body.

No bony abnormality.  No arthropathy.

## 2021-03-09 ENCOUNTER — Other Ambulatory Visit: Payer: Self-pay

## 2021-03-09 ENCOUNTER — Emergency Department (HOSPITAL_COMMUNITY)
Admission: EM | Admit: 2021-03-09 | Discharge: 2021-03-09 | Disposition: A | Discharge status: Home / Self Care | Payer: BC Managed Care – PPO | Attending: Emergency Medicine | Admitting: Emergency Medicine

## 2021-03-09 ENCOUNTER — Encounter (HOSPITAL_COMMUNITY): Payer: Self-pay | Admitting: Emergency Medicine

## 2021-03-09 DIAGNOSIS — U071 COVID-19: Secondary | ICD-10-CM | POA: Insufficient documentation

## 2021-03-09 DIAGNOSIS — Z21 Asymptomatic human immunodeficiency virus [HIV] infection status: Secondary | ICD-10-CM | POA: Insufficient documentation

## 2021-03-09 DIAGNOSIS — H6592 Unspecified nonsuppurative otitis media, left ear: Secondary | ICD-10-CM | POA: Insufficient documentation

## 2021-03-09 DIAGNOSIS — F17210 Nicotine dependence, cigarettes, uncomplicated: Secondary | ICD-10-CM | POA: Insufficient documentation

## 2021-03-09 DIAGNOSIS — I10 Essential (primary) hypertension: Secondary | ICD-10-CM | POA: Insufficient documentation

## 2021-03-09 DIAGNOSIS — Z28310 Unvaccinated for covid-19: Secondary | ICD-10-CM | POA: Insufficient documentation

## 2021-03-09 DIAGNOSIS — Z79899 Other long term (current) drug therapy: Secondary | ICD-10-CM | POA: Insufficient documentation

## 2021-03-09 DIAGNOSIS — Z8616 Personal history of COVID-19: Secondary | ICD-10-CM | POA: Insufficient documentation

## 2021-03-09 NOTE — ED Triage Notes (Signed)
Pt c/o left ear swelling and not being able to hear since this AM. Pt covid + 03/07/21

## 2021-03-09 NOTE — ED Provider Notes (Signed)
[facility] EMERGENCY DEPARTMENT Provider Note   CSN: [id number] Arrival date & time: 03/09/21  1315     History Chief Complaint  Patient presents with   Ear Fullness    Kyle Gill is a 40 y.o. male with a history of hypertension and HIV.  Patient presents emergency department with a chief complaint of left ear fullness.  Patient reports that fullness started yesterday evening.  Patient denies any barotrauma.  Patient also endorses decreased hearing and pain to left ear.  Patient rates pain 6/10 on the pain scale.  Denies any ear discharge, fever, visual disturbance.  Patient reports that he tested positive for COVID-19 on a home test 2 days prior.  Patient reports having COVID-19 previously.  Patient denies any vaccination for COVID-19.  Patient endorses chills and sore throat.   Ear Fullness Pertinent negatives include no chest pain, no abdominal pain, no headaches and no shortness of breath.      Past Medical History:  Diagnosis Date   Bell's palsy    HIV positive (HCC)    Hypertension    Pneumonia     Patient Active Problem List   Diagnosis Date Noted   Cigarette smoker 03/24/2019   Bankart lesion of right shoulder 09/12/2016   HIV DISEASE 03/10/2007   DEPRESSION 03/10/2007   VISUAL CHANGES 03/10/2007    Past Surgical History:  Procedure Laterality Date   SHOULDER ARTHROSCOPY WITH LABRAL REPAIR Right 06/13/2016   Procedure: SHOULDER DIAGNOSTIC OPERATIVE ARTHROSCOPY WITH LABRAL REPAIR;  Surgeon: Cammy Copa, MD;  Location: [facility] OR;  Service: Orthopedics;  Laterality: Right;       No family history on file.  Social History   Tobacco Use   Smoking status: Every Day    Packs/day: 0.50    Pack years: 0.00    Types: Cigarettes   Smokeless tobacco: Never  Vaping Use   Vaping Use: Never used  Substance Use Topics   Alcohol use: Yes    Comment: occasionally   Drug use: Yes    Types: Marijuana    Home Medications Prior to Admission medications    Medication Sig Start Date End Date Taking? Authorizing Provider  clindamycin (CLEOCIN) 300 MG capsule Take 1 capsule (300 mg total) by mouth 3 (three) times daily. 05/04/20   Triplett, Tammy, PA-C  gabapentin (NEURONTIN) 300 MG capsule Take 1 capsule (300 mg total) by mouth at bedtime. Patient not taking: Reported on 12/30/2019 12/11/17   Vickki Hearing, MD  HYDROcodone-acetaminophen (NORCO/VICODIN) 5-325 MG tablet Take one tab po q 4 hrs prn pain 05/04/20   Triplett, Tammy, PA-C  lisinopril-hydrochlorothiazide (ZESTORETIC) 10-12.5 MG tablet Take 1 tablet by mouth daily. 10/18/19   [provider]    Allergies    Aspirin, Penicillins, Sulfamethoxazole-trimethoprim, and Chlorhexidine  Review of Systems   Review of Systems  Constitutional:  Positive for chills. Negative for fever.  HENT:  Positive for ear pain and sore throat. Negative for drooling, ear discharge, facial swelling, trouble swallowing and voice change.   Eyes:  Negative for visual disturbance.  Respiratory:  Negative for shortness of breath.   Cardiovascular:  Negative for chest pain.  Gastrointestinal:  Negative for abdominal pain, nausea and vomiting.  Musculoskeletal:  Positive for myalgias. Negative for back pain and neck pain.  Skin:  Negative for color change and rash.  Allergic/Immunologic: Positive for immunocompromised state.  Neurological:  Negative for dizziness, syncope, light-headedness and headaches.  Psychiatric/Behavioral:  Negative for confusion.    Physical Exam Updated  Vital Signs BP (!) 170/103 (BP Location: Right Arm)   Pulse 79   Temp 98.5 F (36.9 C) (Oral)   Resp 15   Ht 6\' 4"  (1.93 m)   Wt 104.3 kg   SpO2 98%   BMI 27.99 kg/m   Physical Exam Vitals and nursing note reviewed.  Constitutional:      General: He is not in acute distress.    Appearance: He is not ill-appearing, toxic-appearing or diaphoretic.  HENT:     Head: Normocephalic and atraumatic. No raccoon eyes, Battle's  sign, abrasion, contusion, masses, right periorbital erythema, left periorbital erythema or laceration.     Jaw: No trismus, tenderness, swelling or pain on movement.     Salivary Glands: Right salivary gland is not diffusely enlarged or tender. Left salivary gland is not diffusely enlarged or tender.     Right Ear: No decreased hearing noted. No laceration, drainage, swelling or tenderness. No middle ear effusion. There is no impacted cerumen. No foreign body. No mastoid tenderness. No PE tube. No hemotympanum. Tympanic membrane is not injected, scarred, perforated, erythematous, retracted or bulging.     Left Ear: Decreased hearing noted. No laceration, drainage, swelling or tenderness. A middle ear effusion is present. There is no impacted cerumen. No foreign body. No mastoid tenderness. No PE tube. No hemotympanum. Tympanic membrane is not injected, scarred, perforated, erythematous, retracted or bulging.     Mouth/Throat:     Mouth: Mucous membranes are moist. No angioedema.     Pharynx: Oropharynx is clear. Uvula midline. No pharyngeal swelling, oropharyngeal exudate, posterior oropharyngeal erythema or uvula swelling.     Tonsils: No tonsillar exudate or tonsillar abscesses. 1+ on the right. 1+ on the left.  Eyes:     General: No scleral icterus.       Right eye: No discharge.        Left eye: No discharge.     Conjunctiva/sclera: Conjunctivae normal.  Cardiovascular:     Rate and Rhythm: Normal rate.  Pulmonary:     Effort: Pulmonary effort is normal. No tachypnea, bradypnea or respiratory distress.     Breath sounds: Normal breath sounds. No stridor.  Musculoskeletal:     Cervical back: Normal range of motion and neck supple. No edema, erythema, signs of trauma, rigidity, torticollis or crepitus. No pain with movement, spinous process tenderness or muscular tenderness. Normal range of motion.  Skin:    General: Skin is warm and dry.  Neurological:     General: No focal deficit  present.     Mental Status: He is alert.  Psychiatric:        Behavior: Behavior is cooperative.    ED Results / Procedures / Treatments   Labs (all labs ordered are listed, but only abnormal results are displayed) Labs Reviewed - No data to display  EKG None  Radiology No results found.  Procedures Procedures   Medications Ordered in ED Medications - No data to display  ED Course  I have reviewed the triage vital signs and the nursing notes.  Pertinent labs & imaging results that were available during my care of the patient were reviewed by me and considered in my medical decision making (see chart for details).    MDM Rules/Calculators/A&P                          Alert 40 year old male no acute distress, nontoxic-appearing.  Patient presents emerged department patient complaint of decreased  hearing to left ear, left ear pain, and left ear fullness.  Patient also reports that he tested positive for COVID-19 on a home test 2 days prior.  Visit exam is not consistent with acute otitis media.  Does appear to have fluid behind left ear consistent with otitis media effusion.  Patient has no angioedema, peritonsillar abscess, or swelling to neck.  Full range of motion to neck.  No pain on passive range of motion of neck.  No erythema, swelling, or exudate noted to tonsils or oropharynx.  Discussed antiviral treatment for COVID-19 with patient due to his immunocompromise status from HIV.  Patient defers treatment at this time.  Patient will follow up with primary care provider as needed.  Patient given strict return precautions.  Patient expressed understanding of all instructions and is agreeable with this plan.  Kyle Gill was evaluated in Emergency Department on 03/09/2021 for the symptoms described in the history of present illness. He was evaluated in the context of the global COVID-19 pandemic, which necessitated consideration that the patient might be at risk for infection  with the SARS-CoV-2 virus that causes COVID-19. Institutional protocols and algorithms that pertain to the evaluation of patients at risk for COVID-19 are in a state of rapid change based on information released by regulatory bodies including the CDC and federal and state organizations. These policies and algorithms were followed during the patient's care in the ED.   Final Clinical Impression(s) / ED Diagnoses Final diagnoses:  Left otitis media with effusion    Rx / DC Orders ED Discharge Orders     None        Berneice Heinrich 03/09/21 2220    Vanetta Mulders, MD 03/11/21 [phone]

## 2021-03-09 NOTE — ED Notes (Signed)
ED Provider at bedside. 

## 2021-03-09 NOTE — Discharge Instructions (Signed)
Your physical exam was consistent with otitis media effusion which is fluid in the inner ear.  You may use nasal saline sprays, decongestions to help with your symptoms.  Your symptoms should resolve within 6 to 10 weeks.  If they do not resolve faster than this please follow-up with your primary care provider.  Get help right away if: You have severe pain that is not controlled with medicine. You have swelling, redness, or pain around your ear. You have stiffness in your neck. A part of your face is not moving (paralyzed). The bone behind your ear (mastoid) is tender when you touch it. You develop a severe headache.

## 2021-08-28 ENCOUNTER — Other Ambulatory Visit: Payer: Self-pay

## 2021-08-28 ENCOUNTER — Emergency Department (HOSPITAL_COMMUNITY)
Admission: EM | Admit: 2021-08-28 | Discharge: 2021-08-28 | Disposition: A | Discharge status: Home / Self Care | Payer: Self-pay | Attending: Emergency Medicine | Admitting: Emergency Medicine

## 2021-08-28 ENCOUNTER — Encounter (HOSPITAL_COMMUNITY): Payer: Self-pay

## 2021-08-28 DIAGNOSIS — I10 Essential (primary) hypertension: Secondary | ICD-10-CM | POA: Insufficient documentation

## 2021-08-28 DIAGNOSIS — F17210 Nicotine dependence, cigarettes, uncomplicated: Secondary | ICD-10-CM | POA: Insufficient documentation

## 2021-08-28 DIAGNOSIS — Z20822 Contact with and (suspected) exposure to covid-19: Secondary | ICD-10-CM | POA: Insufficient documentation

## 2021-08-28 DIAGNOSIS — H6993 Unspecified Eustachian tube disorder, bilateral: Secondary | ICD-10-CM | POA: Insufficient documentation

## 2021-08-28 DIAGNOSIS — Z79899 Other long term (current) drug therapy: Secondary | ICD-10-CM | POA: Insufficient documentation

## 2021-08-28 DIAGNOSIS — J329 Chronic sinusitis, unspecified: Secondary | ICD-10-CM | POA: Insufficient documentation

## 2021-08-28 LAB — RESP PANEL BY RT-PCR (FLU A&B, COVID) ARPGX2
Influenza A by PCR: NEGATIVE
Influenza B by PCR: NEGATIVE
SARS Coronavirus 2 by RT PCR: NEGATIVE

## 2021-08-28 MED ORDER — DOXYCYCLINE HYCLATE 100 MG PO TABS
100.0000 mg | ORAL_TABLET | Freq: Once | ORAL | Status: AC
  Administered 2021-08-28: 10:00:00 100 mg via ORAL
  Filled 2021-08-28: qty 1

## 2021-08-28 MED ORDER — DOXYCYCLINE HYCLATE 100 MG PO CAPS
100.0000 mg | ORAL_CAPSULE | Freq: Two times a day (BID) | ORAL | 0 refills | Status: DC
Start: 2021-08-28 — End: 2022-08-14

## 2021-08-28 NOTE — Discharge Instructions (Addendum)
You may continue taking your home medications including the Flonase nasal spray.  Complete the entire course of the antibiotic prescribed.  You may also benefit from treatments that reduce your nasal congestion including menthol cough drops or Vicks vapor rub, sitting in a steamy bathroom or shower can also help at least temporarily reduce congestion and take the pressure off of your ears.  Plan to call Dr. Bruce Donath for further evaluation if the symptoms are not improved with this treatment plan.  Make sure you are taking your blood pressure medication as your blood pressure is elevated here.

## 2021-08-28 NOTE — ED Provider Notes (Signed)
[facility] EMERGENCY DEPARTMENT Provider Note   CSN: [id number] Arrival date & time: 08/28/21  7510     History Chief Complaint  Patient presents with   Ear Fullness    Kyle Gill is a 40 y.o. male presenting with acute on chronic nasal congestion and bilateral ear pain.  He has a history that suggests chronic nasal congestion and was treated for an otitis media 5 months ago, first with a course of clindamycin, followed by Keflex and obtained minimal improvement in symptoms at that time.  Over the past several weeks he has had increasing bilateral ear fullness, intermittent pain in his right ear with decreased hearing acuity, also with developing pain in his left ear as well.  He has had no documented fevers or chills.  He does endorse thick and purulent nasal discharge.  He has been using Flonase nasal spray twice daily, also using an OTC pseudoephedrine product.  These provide minimal relief.  The history is provided by the patient.      Past Medical History:  Diagnosis Date   Bell's palsy    HIV positive (HCC)    Hypertension    Pneumonia     Patient Active Problem List   Diagnosis Date Noted   Cigarette smoker 03/24/2019   Bankart lesion of right shoulder 09/12/2016   HIV DISEASE 03/10/2007   DEPRESSION 03/10/2007   VISUAL CHANGES 03/10/2007    Past Surgical History:  Procedure Laterality Date   SHOULDER ARTHROSCOPY WITH LABRAL REPAIR Right 06/13/2016   Procedure: SHOULDER DIAGNOSTIC OPERATIVE ARTHROSCOPY WITH LABRAL REPAIR;  Surgeon: Cammy Copa, MD;  Location: [facility] OR;  Service: Orthopedics;  Laterality: Right;       No family history on file.  Social History   Tobacco Use   Smoking status: Every Day    Packs/day: 0.50    Types: Cigarettes   Smokeless tobacco: Never  Vaping Use   Vaping Use: Never used  Substance Use Topics   Alcohol use: Yes    Comment: occasionally   Drug use: Yes    Types: Marijuana    Home Medications Prior to  Admission medications   Medication Sig Start Date End Date Taking? Authorizing Provider  doxycycline (VIBRAMYCIN) 100 MG capsule Take 1 capsule (100 mg total) by mouth 2 (two) times daily. 08/28/21  Yes Joseguadalupe Stan, Raynelle Fanning, PA-C  gabapentin (NEURONTIN) 300 MG capsule Take 1 capsule (300 mg total) by mouth at bedtime. Patient not taking: Reported on 12/30/2019 12/11/17   Vickki Hearing, MD  HYDROcodone-acetaminophen (NORCO/VICODIN) 5-325 MG tablet Take one tab po q 4 hrs prn pain 05/04/20   Triplett, Tammy, PA-C  lisinopril-hydrochlorothiazide (ZESTORETIC) 10-12.5 MG tablet Take 1 tablet by mouth daily. 10/18/19   [provider]    Allergies    Aspirin, Penicillins, Sulfamethoxazole-trimethoprim, and Chlorhexidine  Review of Systems   Review of Systems  Constitutional:  Negative for chills and fever.  HENT:  Positive for congestion, ear pain, hearing loss, postnasal drip, rhinorrhea and sinus pressure. Negative for facial swelling, sore throat, trouble swallowing and voice change.   Eyes:  Negative for discharge.  Respiratory:  Negative for cough, shortness of breath, wheezing and stridor.   Cardiovascular:  Negative for chest pain.  Genitourinary: Negative.   All other systems reviewed and are negative.  Physical Exam Updated Vital Signs BP (!) 143/113    Pulse 86    Temp 97.9 F (36.6 C) (Oral)    Resp 16    Ht 6\' 3"  (1.905  m)    Wt 97.5 kg    SpO2 99%    BMI 26.87 kg/m   Physical Exam Constitutional:      Appearance: He is well-developed.  HENT:     Head: Normocephalic and atraumatic.     Right Ear: Ear canal normal. Tympanic membrane is retracted.     Left Ear: Ear canal normal. A middle ear effusion is present. Tympanic membrane is erythematous and bulging.     Nose: Mucosal edema present. No nasal deformity or rhinorrhea.     Right Turbinates: Swollen.     Left Turbinates: Swollen.     Mouth/Throat:     Mouth: Mucous membranes are moist.     Pharynx: Oropharynx is  clear. Uvula midline. No oropharyngeal exudate or posterior oropharyngeal erythema.     Tonsils: No tonsillar abscesses.  Eyes:     Conjunctiva/sclera: Conjunctivae normal.  Cardiovascular:     Rate and Rhythm: Normal rate.     Heart sounds: Normal heart sounds.  Pulmonary:     Effort: Pulmonary effort is normal. No respiratory distress.     Breath sounds: No wheezing or rales.  Abdominal:     Palpations: Abdomen is soft.     Tenderness: There is no abdominal tenderness.  Musculoskeletal:        General: Normal range of motion.  Skin:    General: Skin is warm and dry.     Findings: No rash.  Neurological:     Mental Status: He is alert and oriented to person, place, and time.    ED Results / Procedures / Treatments   Labs (all labs ordered are listed, but only abnormal results are displayed) Labs Reviewed  RESP PANEL BY RT-PCR (FLU A&B, COVID) ARPGX2    EKG None  Radiology No results found.  Procedures Procedures   Medications Ordered in ED Medications  doxycycline (VIBRA-TABS) tablet 100 mg (100 mg Oral Given 08/28/21 1014)    ED Course  I have reviewed the triage vital signs and the nursing notes.  Pertinent labs & imaging results that were available during my care of the patient were reviewed by me and considered in my medical decision making (see chart for details).    MDM Rules/Calculators/A&P                         Patient with acute on chronic congestion with resulting eustachian tube dysfunction.  Review of chart suggest he was adequately treated in July for similar symptoms but he continues to have persistent symptoms worse now.  He has placed on a course of doxycycline as he is penicillin and Bactrim allergic.  He was encouraged to continue his home treatments including Flonase, also suggested menthol drops, Vicks VapoRub, steam to help eliminate pressure and help relieve eustachian dysfunction.  With chronicity of symptoms he would benefit from seeing an  ENT, he was referred to Dr. Suszanne Conners for this.  Patient is agreeable with this treatment plan.  Noted that his blood pressure is elevated today.  He states he has not taken his blood pressure medications in 2 days as he is just not felt well, he was encouraged to not miss this medication.    Final Clinical Impression(s) / ED Diagnoses Final diagnoses:  Chronic sinusitis, unspecified location  Eustachian tube disorder, bilateral    Rx / DC Orders ED Discharge Orders          Ordered    doxycycline (VIBRAMYCIN) 100 MG  capsule  2 times daily        08/28/21 0959             Burgess Amor, PA-C 08/28/21 1029    Mancel Bale, MD 08/28/21 [phone]

## 2021-08-28 NOTE — ED Triage Notes (Signed)
Pt arrived to ED in POV c/o bilateral ear fullness and at times painful since July but has become more congested, and fatigue.

## 2021-11-05 ENCOUNTER — Emergency Department (HOSPITAL_COMMUNITY)
Admission: EM | Admit: 2021-11-05 | Discharge: 2021-11-05 | Disposition: A | Discharge status: Home / Self Care | Payer: 59 | Attending: Emergency Medicine | Admitting: Emergency Medicine

## 2021-11-05 ENCOUNTER — Encounter (HOSPITAL_COMMUNITY): Payer: Self-pay | Admitting: *Deleted

## 2021-11-05 DIAGNOSIS — Z21 Asymptomatic human immunodeficiency virus [HIV] infection status: Secondary | ICD-10-CM | POA: Insufficient documentation

## 2021-11-05 DIAGNOSIS — G510 Bell's palsy: Secondary | ICD-10-CM

## 2021-11-05 DIAGNOSIS — R29810 Facial weakness: Secondary | ICD-10-CM | POA: Diagnosis present

## 2021-11-05 DIAGNOSIS — F17210 Nicotine dependence, cigarettes, uncomplicated: Secondary | ICD-10-CM | POA: Diagnosis not present

## 2021-11-05 LAB — CBC WITH DIFFERENTIAL/PLATELET
Abs Immature Granulocytes: 0.02 10*3/uL (ref 0.00–0.07)
Basophils Absolute: 0 10*3/uL (ref 0.0–0.1)
Basophils Relative: 1 %
Eosinophils Absolute: 0.2 10*3/uL (ref 0.0–0.5)
Eosinophils Relative: 3 %
HCT: 50.4 % (ref 39.0–52.0)
Hemoglobin: 17.8 g/dL — ABNORMAL HIGH (ref 13.0–17.0)
Immature Granulocytes: 0 %
Lymphocytes Relative: 38 %
Lymphs Abs: 2.7 10*3/uL (ref 0.7–4.0)
MCH: 31.3 pg (ref 26.0–34.0)
MCHC: 35.3 g/dL (ref 30.0–36.0)
MCV: 88.7 fL (ref 80.0–100.0)
Monocytes Absolute: 0.6 10*3/uL (ref 0.1–1.0)
Monocytes Relative: 9 %
Neutro Abs: 3.5 10*3/uL (ref 1.7–7.7)
Neutrophils Relative %: 49 %
Platelets: 244 10*3/uL (ref 150–400)
RBC: 5.68 MIL/uL (ref 4.22–5.81)
RDW: 12.8 % (ref 11.5–15.5)
WBC: 7.1 10*3/uL (ref 4.0–10.5)
nRBC: 0 % (ref 0.0–0.2)

## 2021-11-05 LAB — BASIC METABOLIC PANEL
Anion gap: 8 (ref 5–15)
BUN: 7 mg/dL (ref 6–20)
CO2: 25 mmol/L (ref 22–32)
Calcium: 9.1 mg/dL (ref 8.9–10.3)
Chloride: 104 mmol/L (ref 98–111)
Creatinine, Ser: 0.99 mg/dL (ref 0.61–1.24)
GFR, Estimated: >60 mL/min (ref >60)
Glucose, Bld: 88 mg/dL (ref 70–99)
Potassium: 4 mmol/L (ref 3.5–5.1)
Sodium: 137 mmol/L (ref 135–145)

## 2021-11-05 MED ORDER — VALACYCLOVIR HCL 1 G PO TABS: 1000.0000 mg | ORAL_TABLET | Freq: Three times a day (TID) | ORAL | 0 refills | Status: AC

## 2021-11-05 MED ORDER — PREDNISONE 10 MG PO TABS: 50.0000 mg | ORAL_TABLET | Freq: Every day | ORAL | 0 refills | Status: DC

## 2021-11-05 MED ORDER — PREDNISONE 50 MG PO TABS
60.0000 mg | ORAL_TABLET | Freq: Once | ORAL | Status: AC
  Administered 2021-11-05: 60 mg via ORAL
  Filled 2021-11-05: qty 1

## 2021-11-05 MED ORDER — HYPROMELLOSE (GONIOSCOPIC) 2.5 % OP SOLN: 1.0000 [drp] | Freq: Four times a day (QID) | OPHTHALMIC | 0 refills | Status: DC | PRN

## 2021-11-05 MED ORDER — VALACYCLOVIR HCL 500 MG PO TABS
1000.0000 mg | ORAL_TABLET | Freq: Once | ORAL | Status: AC
  Administered 2021-11-05: 1000 mg via ORAL
  Filled 2021-11-05: qty 2

## 2021-11-05 NOTE — ED Triage Notes (Signed)
Right side of face numb, right eye tearing, history of bells palsley ?

## 2021-11-05 NOTE — ED Provider Notes (Signed)
?[facility] EMERGENCY DEPARTMENT ?Provider Note ? ? ?CSN: [id number] ?Arrival date & time: 11/05/21  1402 ? ?  ? ?History ? ?No chief complaint on file. ? ? ?Kyle Gill is a 41 y.o. male. ? ?HPI ? ?  ? ?41 year old male comes in with chief complaint of facial weakness. ?Patient has history of Bell's palsy to the right side, and indicates that he feels like he is having Bell's palsy again on the left side. ? ?He was last normal yesterday morning.  He indicates that as the day went on, he started noticing feeling no different on the right side of the face.  Subsequently, he is seeing more asymmetry.  He is not having any new vision change, one-sided numbness, tingling, weakness, balance issues, slurred speech.  Patient indicates that when he tries to smoke, he is having difficulty squeezing the cigarette between his lips on the left side. ? ?He does have chronic ear infection and just finished a course of antibiotics and steroids.  The right side of the ear was affected.  Right now he has no hearing complaints.  He does indicate that there is some change in taste. ? ?Patient's past medical history is pertinent for HIV, Bell's palsy and he is a tobacco user.  Denies any substance use disorder. ? ?Home Medications ?Prior to Admission medications   ?Medication Sig Start Date End Date Taking? Authorizing Provider  ?hydroxypropyl methylcellulose / hypromellose (ISOPTO TEARS / GONIOVISC) 2.5 % ophthalmic solution Place 1 drop into the left eye 4 (four) times daily as needed for dry eyes. 11/05/21  Yes Derwood Kaplan, MD  ?predniSONE (DELTASONE) 10 MG tablet Take 5 tablets (50 mg total) by mouth daily. 11/06/21  Yes Derwood Kaplan, MD  ?valACYclovir (VALTREX) 1000 MG tablet Take 1 tablet (1,000 mg total) by mouth 3 (three) times daily for 7 days. 11/05/21 11/12/21 Yes Derwood Kaplan, MD  ?doxycycline (VIBRAMYCIN) 100 MG capsule Take 1 capsule (100 mg total) by mouth 2 (two) times daily. 08/28/21   Burgess Amor, PA-C  ?gabapentin  (NEURONTIN) 300 MG capsule Take 1 capsule (300 mg total) by mouth at bedtime. ?Patient not taking: Reported on 12/30/2019 12/11/17   Vickki Hearing, MD  ?HYDROcodone-acetaminophen (NORCO/VICODIN) 5-325 MG tablet Take one tab po q 4 hrs prn pain 05/04/20   Triplett, Tammy, PA-C  ?lisinopril-hydrochlorothiazide (ZESTORETIC) 10-12.5 MG tablet Take 1 tablet by mouth daily. 10/18/19   [provider]  ?   ? ?Allergies    ?Aspirin, Penicillins, Sulfamethoxazole-trimethoprim, and Chlorhexidine   ? ?Review of Systems   ?Review of Systems  ?All other systems reviewed and are negative. ? ?Physical Exam ?Updated Vital Signs ?BP (!) 123/99   Pulse 61   Temp 98.2 ?F (36.8 ?C) (Oral)   Resp 18   SpO2 99%  ?Physical Exam ?Vitals and nursing note reviewed.  ?Constitutional:   ?   Appearance: He is well-developed.  ?HENT:  ?   Head: Atraumatic.  ?   Right Ear: Tympanic membrane normal.  ?   Left Ear: Tympanic membrane normal.  ?Eyes:  ?   Extraocular Movements: Extraocular movements intact.  ?   Pupils: Pupils are equal, round, and reactive to light.  ?   Comments: Intact peripheral visual fields  ?Cardiovascular:  ?   Rate and Rhythm: Normal rate.  ?Pulmonary:  ?   Effort: Pulmonary effort is normal.  ?Musculoskeletal:  ?   Cervical back: Neck supple.  ?Skin: ?   General: Skin is warm.  ?Neurological:  ?  Mental Status: He is alert and oriented to person, place, and time.  ?   Comments: Left-sided facial droop, clear difference noted with blinking of the eyes, left side of the eyes not completely closing, there is also no left-sided central sparing. ? ?Upper and lower extremity strength is 4+ out of 5 and bilaterally equal, no sensory disturbance or upper and lower extremity.  ? ? ?ED Results / Procedures / Treatments   ?Labs ?(all labs ordered are listed, but only abnormal results are displayed) ?Labs Reviewed  ?CBC WITH DIFFERENTIAL/PLATELET - Abnormal; Notable for the following components:  ?    Result Value  ?  Hemoglobin 17.8 (*)   ? All other components within normal limits  ?BASIC METABOLIC PANEL  ?RAPID URINE DRUG SCREEN, HOSP PERFORMED  ?URINALYSIS, ROUTINE W REFLEX MICROSCOPIC  ? ? ?EKG ?None ? ?Radiology ?No results found. ? ?Procedures ?Procedures  ? ? ?Medications Ordered in ED ?Medications  ?valACYclovir (VALTREX) tablet 1,000 mg (1,000 mg Oral Given 11/05/21 1736)  ?predniSONE (DELTASONE) tablet 60 mg (60 mg Oral Given 11/05/21 1744)  ? ? ?ED Course/ Medical Decision Making/ A&P ?Clinical Course as of 11/05/21 1747  ?Mon Nov 05, 2021  ?1746 CBC with Differential(!) ?CBC, metabolic profile results reviewed, they are normal. [AN]  ?  ?Clinical Course User Index ?[AN] Derwood Kaplan, MD  ? ?                        ?Medical Decision Making ?Amount and/or Complexity of Data Reviewed ?Labs: ordered. ? ?Risk ?OTC drugs. ?Prescription drug management. ? ?41 year old male with history of Bell's palsy affecting the right side 10 years ago comes in with chief complaint of left-sided facial droop ? ?Differential diagnosis includes Bell's palsy, ischemic stroke, brain bleed. ?Given history of HIV, neuromuscular conditions also possible.  No headaches, neck pain, rash -all reassuring given the HIV history. ? ?On exam, patient has clear left-sided drooping.  No clear evidence of central sparing.  Patient is able to shut his eye, but is blinking is definitely abnormal on the left side.  He has some loss of taste, which also is more consistent with Bell's palsy.  No other neurologic symptoms. ? ?Given the patient's neuro exam is otherwise nonfocal besides evidence of Bell's palsy, I do not think a CT scan of the brain is needed. ? ?What would be better is for patient to follow-up with neurologist as an outpatient.  We will start him on medications for Bell's palsy.  He has been advised to stop smoking.  I have asked him to start taking baby aspirin.  Work note provided at the patient's request.  Strict ER return precautions  discussed, patient will return to the ER immediately if he starts having any new numbness, tingling, weakness, vision change. ? ?Final Clinical Impression(s) / ED Diagnoses ?Final diagnoses:  ?Facial paralysis/Bells palsy  ?Left-sided Bell's palsy  ? ? ?Rx / DC Orders ?ED Discharge Orders   ? ?      Ordered  ?  predniSONE (DELTASONE) 10 MG tablet  Daily       ? 11/05/21 1715  ?  valACYclovir (VALTREX) 1000 MG tablet  3 times daily       ? 11/05/21 1715  ?  hydroxypropyl methylcellulose / hypromellose (ISOPTO TEARS / GONIOVISC) 2.5 % ophthalmic solution  4 times daily PRN       ? 11/05/21 1715  ? ?  ?  ? ?  ? ? ?  ?  Derwood Kaplan, MD ?11/05/21 1747 ? ?

## 2021-11-05 NOTE — ED Provider Triage Note (Signed)
Emergency Medicine Provider Triage Evaluation Note ? ?Kyle Gill , a 41 y.o. male  was evaluated in triage.  Pt complains of bell's palsy. ? ?Review of Systems  ?Positive: Facial droop ?Negative: Vision change, numbness ? ?Physical Exam  ?BP (!) 160/108   Pulse 88   Temp 98.2 ?F (36.8 ?C) (Oral)   Resp 20   SpO2 97%  ?Gen:   Awake, no distress   ?Resp:  Normal effort  ?MSK:   Moves extremities without difficulty  ?Other:  L sided nasolabial flattening, L sided facial droop ? ?Medical Decision Making  ?Medically screening exam initiated at 4:29 PM.  Appropriate orders placed.  Kyle Gill was informed that the remainder of the evaluation will be completed by another provider, this initial triage assessment does not replace that evaluation, and the importance of remaining in the ED until their evaluation is complete. ? ? ?  ?Varney Biles, MD ?11/05/21 1630 ? ?

## 2021-11-05 NOTE — Discharge Instructions (Addendum)
You have Bell's Palsy- please start the medications prescribed ?You received your first dose of prednisone in the ER, therefore that can be started tomorrow. ? ?Provided is the contact number for the neurologist.  Please contact them for follow-up in 7 days. ? ?Return to the ER if you start having new neurologic symptoms -such as one-sided numbness, tingling, vision change. ?

## 2021-11-13 ENCOUNTER — Encounter: Payer: Self-pay | Admitting: Neurology

## 2021-12-04 ENCOUNTER — Encounter: Payer: Self-pay | Admitting: Family Medicine

## 2021-12-04 ENCOUNTER — Ambulatory Visit: Payer: 59 | Admitting: Family Medicine

## 2021-12-04 VITALS — BP 118/88 | HR 90 | Ht 75.0 in | Wt 217.2 lb

## 2021-12-04 DIAGNOSIS — B20 Human immunodeficiency virus [HIV] disease: Secondary | ICD-10-CM

## 2021-12-04 DIAGNOSIS — Z7689 Persons encountering health services in other specified circumstances: Secondary | ICD-10-CM

## 2021-12-04 DIAGNOSIS — E559 Vitamin D deficiency, unspecified: Secondary | ICD-10-CM

## 2021-12-04 DIAGNOSIS — F419 Anxiety disorder, unspecified: Secondary | ICD-10-CM | POA: Insufficient documentation

## 2021-12-04 DIAGNOSIS — H6690 Otitis media, unspecified, unspecified ear: Secondary | ICD-10-CM | POA: Insufficient documentation

## 2021-12-04 DIAGNOSIS — G510 Bell's palsy: Secondary | ICD-10-CM

## 2021-12-04 DIAGNOSIS — H6693 Otitis media, unspecified, bilateral: Secondary | ICD-10-CM

## 2021-12-04 DIAGNOSIS — F17210 Nicotine dependence, cigarettes, uncomplicated: Secondary | ICD-10-CM

## 2021-12-04 DIAGNOSIS — I10 Essential (primary) hypertension: Secondary | ICD-10-CM

## 2021-12-04 MED ORDER — CLONAZEPAM 0.5 MG PO TABS: 0.5000 mg | ORAL_TABLET | Freq: Two times a day (BID) | ORAL | 1 refills | Status: DC | PRN

## 2021-12-04 MED ORDER — OLMESARTAN MEDOXOMIL 20 MG PO TABS: 20.0000 mg | ORAL_TABLET | Freq: Every day | ORAL | 1 refills | Status: DC

## 2021-12-04 NOTE — Patient Instructions (Addendum)
I appreciate the opportunity to provide care to you today! ?  ?Follow up: 3 months for your physical ? ?Labs: lipid profile, Thyroid, hemoglobin A1c, vit D levels ? ?Referrals today-  none ? ?Refilled your olmesartan and send  prescription for clonazepam for your anxiety ? ? ?  ?It was a pleasure to see you and I look forward to continuing to work together on your health and well-being. ?Please do not hesitate to call the office if you need care or have questions about your care. ?  ?Have a wonderful day and week. ?With Gratitude, ?Gilmore Laroche MSN, FNP-BC  ?

## 2021-12-04 NOTE — Assessment & Plan Note (Signed)
Referred to neurology with an upcoming appt on 01/2022 with Dr.Doonquah Kofi.  ? ?

## 2021-12-04 NOTE — Assessment & Plan Note (Addendum)
No pain noted with palpation of the Right and left ears. ?Pearly grey TM with no signs of infection. ?Upcoming appt with Dr. Suszanne Conners, an otolaryngologist, by the month's end. ?

## 2021-12-04 NOTE — Assessment & Plan Note (Signed)
Physical examination performed ?Record requested from the health department ?

## 2021-12-04 NOTE — Assessment & Plan Note (Signed)
Pt reports taking clonazepam in the past for his anxiety and prefers it over Prozac.  ?

## 2021-12-04 NOTE — Assessment & Plan Note (Signed)
Declines seeing infectious disease for his Antiretroviral therapy ?

## 2021-12-04 NOTE — Assessment & Plan Note (Signed)
Pt is currently smoking 0.5 packs a day and plans to quit once his stress level decreases.  ?  ?Smokes about 0.5 pack/day ? ?Asked about quitting: confirms that he currently smokes cigarettes ?Advise to quit smoking: Educated about QUITTING to reduce the risk of cancer, cardio and cerebrovascular disease. ?Assess willingness: Unwilling to quit at this time, but is working on cutting back. ?Assist with counseling and pharmacotherapy: Counseled for 5 minutes and literature provided. ?Arrange for follow up: follow up in 3 months and continue to offer help. ? ?

## 2021-12-04 NOTE — Assessment & Plan Note (Signed)
Controlled ?Refilled Olmesartan ?

## 2021-12-04 NOTE — Progress Notes (Signed)
? ?New Patient Office Visit ? ?Subjective:  ?Patient ID: Kyle Gill, male    DOB: 10/02/1980  Age: 41 y.o. MRN: [id number] ? ?CC:  ?Chief Complaint  ?Patient presents with  ? New Patient (Initial Visit)  ?  States he was diagnosed with bells palsy recently on both eyes. States he had a bad ear infection for 6 months feels as if it is coming back. Needs refill on bp medication.   ? ? ?HPI ?Kyle Gill is a 41 y.o. male with past medical history of HIV, depression, bell palsy, chronic ear infection, anxiety, and depression who presents for establishing care. The patient works as a Doctor, [employer] at a nursing home and lives with his partner of 15 years. Ten years ago, Kyle Gill had right-sided bell palsy and was recently seen in the ED to have left-sided bell palsy and attributed the cause to not taking his blood pressure medications.  ? ?The patient has been out of his blood pressure medications for a month due to not having a PCP. Following his recent diagnosis in the ED, the patient was referred to neurology with an upcoming appt on 01/2022 with Dr.Doonquah Kofi. The patient admits being unable to shut his left eye since and reports sleeping with an eye patch. ? ?The patient c/o of right ear pain, pressure, and fullness starting on 11/30/21. The patient has an upcoming appt with Dr. Suszanne Connerseoh, an otolaryngologist, by the month's end. No ear pain, pressure, and fullness was reported today.  ? ?At 41 y.o, the patient was diagnosed with HIV and used to follow up with infectious disease for his antiretroviral therapy but stopped treatments because of the side effects of the medications, stating that he didn't like how the drugs made him feel. Since then, the patient has not taken any antiretroviral therapy. ? ?The patient reports that he was diagnosed with anxiety and depression after his HIV diagnosis. ? ? ?Past Medical History:  ?Diagnosis Date  ? Bell's palsy   ? HIV positive (HCC)   ? Hypertension   ? Pneumonia   ? ? ?Past Surgical  History:  ?Procedure Laterality Date  ? SHOULDER ARTHROSCOPY WITH LABRAL REPAIR Right 06/13/2016  ? Procedure: SHOULDER DIAGNOSTIC OPERATIVE ARTHROSCOPY WITH LABRAL REPAIR;  Surgeon: Cammy CopaScott Gregory Dean, MD;  Location: [facility] OR;  Service: Orthopedics;  Laterality: Right;  ? ? ?History reviewed. No pertinent family history. ? ?Social History  ? ?Socioeconomic History  ? Marital status: Single  ?  Spouse name: Not on file  ? Number of children: Not on file  ? Years of education: Not on file  ? Highest education level: Not on file  ?Occupational History  ? Not on file  ?Tobacco Use  ? Smoking status: Every Day  ?  Packs/day: 0.50  ?  Types: Cigarettes  ? Smokeless tobacco: Never  ?Vaping Use  ? Vaping Use: Never used  ?Substance and Sexual Activity  ? Alcohol use: Yes  ?  Comment: occasionally  ? Drug use: Yes  ?  Types: Marijuana  ? Sexual activity: Yes  ?  Birth control/protection: None  ?Other Topics Concern  ? Not on file  ?Social History Narrative  ? Not on file  ? ?Social Determinants of Health  ? ?Financial Resource Strain: Not on file  ?Food Insecurity: Not on file  ?Transportation Needs: Not on file  ?Physical Activity: Not on file  ?Stress: Not on file  ?Social Connections: Not on file  ?Intimate Partner Violence: Not on file  ? ? ?  ROS ?Review of Systems  ?Constitutional:  Negative for chills, fatigue and fever.  ?HENT:  Positive for ear pain. Negative for ear discharge, facial swelling, sinus pressure, sinus pain and sore throat.   ?Eyes:  Negative for pain and redness.  ?Respiratory:  Negative for cough and shortness of breath.   ?Cardiovascular:  Negative for chest pain and palpitations.  ?Gastrointestinal:  Negative for constipation, diarrhea and vomiting.  ?Endocrine: Negative for polydipsia, polyphagia and polyuria.  ?Genitourinary:  Negative for penile discharge, penile pain and penile swelling.  ?Musculoskeletal:  Negative for back pain, gait problem, joint swelling and neck pain.  ?Skin:  Negative for  rash.  ?Neurological:  Positive for headaches. Negative for dizziness, light-headedness and numbness.  ?Psychiatric/Behavioral:  Negative for self-injury and suicidal ideas.   ? ?Objective:  ? ?Today's Vitals: BP 118/88   Pulse 90   Ht 6\' 3"  (1.905 m)   Wt 217 lb 3.2 oz (98.5 kg)   SpO2 97%   BMI 27.15 kg/m?  ? ?Physical Exam ?Constitutional:   ?   Appearance: Normal appearance. He is not ill-appearing or toxic-appearing.  ?HENT:  ?   Head: Normocephalic.  ?   Right Ear: Hearing, tympanic membrane and external ear normal. No drainage or tenderness. There is no impacted cerumen.  ?   Left Ear: Hearing, tympanic membrane and external ear normal. No drainage or tenderness. There is no impacted cerumen.  ?   Nose: Nose normal. No congestion or rhinorrhea.  ?   Mouth/Throat:  ?   Mouth: Mucous membranes are moist.  ?   Pharynx: No oropharyngeal exudate or posterior oropharyngeal erythema.  ?Eyes:  ?   General:     ?   Right eye: No discharge.     ?   Left eye: No discharge.  ?   Extraocular Movements: Extraocular movements intact.  ?   Pupils: Pupils are equal, round, and reactive to light.  ?Cardiovascular:  ?   Rate and Rhythm: Normal rate and regular rhythm.  ?   Pulses: Normal pulses.  ?   Heart sounds: Normal heart sounds. No murmur heard. ?Pulmonary:  ?   Effort: Pulmonary effort is normal. No respiratory distress.  ?   Breath sounds: Normal breath sounds. No wheezing.  ?Abdominal:  ?   General: Bowel sounds are normal. There is no distension.  ?   Tenderness: There is no abdominal tenderness.  ?Musculoskeletal:     ?   General: No deformity or signs of injury.  ?   Cervical back: Normal range of motion. No rigidity.  ?Lymphadenopathy:  ?   Cervical: No cervical adenopathy.  ?Skin: ?   Capillary Refill: Capillary refill takes less than 2 seconds.  ?   Findings: No bruising, lesion or rash.  ?Neurological:  ?   Mental Status: He is alert and oriented to person, place, and time.  ?   Cranial Nerves: Cranial  nerve deficit present.  ?   Motor: No weakness.  ?   Comments: Cranial nerve VII- inability to close the left eye  ?Psychiatric:     ?   Thought Content: Thought content normal.  ? ? ?Assessment & Plan:  ? ?Problem List Items Addressed This Visit   ? ?  ? Cardiovascular and Mediastinum  ? Essential hypertension  ?  Controlled ?Refilled Olmesartan ?  ?  ? Relevant Medications  ? olmesartan (BENICAR) 20 MG tablet  ?  ? Nervous and Auditory  ? Chronic ear infection  ?  No pain noted with palpation of the Right and left ears. ?Pearly grey TM with no signs of infection. ?Upcoming appt with Dr. Suszanne Conners, an otolaryngologist, by the month's end. ?  ?  ? Bell's palsy  ?   Referred to neurology with an upcoming appt on 01/2022 with Dr.Doonquah Kofi.  ? ?  ?  ? Relevant Medications  ? clonazePAM (KLONOPIN) 0.5 MG tablet  ?  ? Other  ? HIV DISEASE  ?  Declines seeing infectious disease for his Antiretroviral therapy ?  ?  ? Cigarette smoker  ?  Pt is currently smoking 0.5 packs a day and plans to quit once his stress level decreases.  ?  ?Smokes about 0.5 pack/day ? ?Asked about quitting: confirms that he currently smokes cigarettes ?Advise to quit smoking: Educated about QUITTING to reduce the risk of cancer, cardio and cerebrovascular disease. ?Assess willingness: Unwilling to quit at this time, but is working on cutting back. ?Assist with counseling and pharmacotherapy: Counseled for 5 minutes and literature provided. ?Arrange for follow up: follow up in 3 months and continue to offer help. ? ?  ?  ? Anxiety - Primary  ?  Pt reports taking clonazepam in the past for his anxiety and prefers it over Prozac.  ?  ?  ? Relevant Medications  ? clonazePAM (KLONOPIN) 0.5 MG tablet  ? Encounter to establish care  ?  Physical examination performed ?Record requested from the health department ?  ?  ? ?Other Visit Diagnoses   ? ? Establishing care with new doctor, encounter for      ? Relevant Orders  ? Lipid Profile  ? Hemoglobin A1C  ?  TSH + free T4  ? Vitamin D deficiency      ? Relevant Orders  ? Vitamin D (25 hydroxy)  ? ?  ? ? ?Outpatient Encounter Medications as of 12/04/2021  ?Medication Sig  ? clonazePAM (KLONOPIN) 0.5 MG tablet Take 1 tab

## 2021-12-11 ENCOUNTER — Other Ambulatory Visit (HOSPITAL_COMMUNITY): Payer: Self-pay

## 2021-12-11 ENCOUNTER — Other Ambulatory Visit: Payer: Self-pay | Admitting: Family Medicine

## 2021-12-11 DIAGNOSIS — E559 Vitamin D deficiency, unspecified: Secondary | ICD-10-CM

## 2021-12-11 MED ORDER — VITAMIN D3 125 MCG (5000 UT) PO CAPS
5000.0000 [IU] | ORAL_CAPSULE | Freq: Every day | ORAL | 3 refills | Status: DC
  Filled 2021-12-11: qty 30, 30d supply, fill #0

## 2021-12-18 LAB — VITAMIN D 25 HYDROXY (VIT D DEFICIENCY, FRACTURES): Vit D, 25-Hydroxy: 23.3 ng/mL — ABNORMAL LOW (ref 30.0–100.0)

## 2021-12-18 LAB — HEMOGLOBIN A1C
Est. average glucose Bld gHb Est-mCnc: 123 mg/dL
Hgb A1c MFr Bld: 5.9 % — ABNORMAL HIGH (ref 4.8–5.6)

## 2021-12-18 LAB — LIPID PANEL
Chol/HDL Ratio: 4.3 ratio (ref 0.0–5.0)
Cholesterol, Total: 172 mg/dL (ref 100–199)
HDL: 40 mg/dL (ref >39)
LDL Chol Calc (NIH): 95 mg/dL (ref 0–99)
Triglycerides: 214 mg/dL — ABNORMAL HIGH (ref 0–149)
VLDL Cholesterol Cal: 37 mg/dL (ref 5–40)

## 2021-12-18 LAB — TSH+FREE T4

## 2021-12-18 NOTE — Progress Notes (Signed)
Upon calling pt to give him lab results, pt mentioned blisters on his feet, he is wondering if this could be related to his diabetes.

## 2021-12-18 NOTE — Progress Notes (Signed)
Many things can cause foot blisters, including diabetes. However, the patient has Pre-diabetes, which means his blood sugar levels are higher than normal but not high enough yet to be diagnosed with type 2 diabetes. Walking or standing for long hours, Walking in sandals, boots, or shoes that don't fit properly for long periods can result in a blister. Having consistent foot moisture and humidity can easily lead to blister formation. ?Advise him to set an appointment to have [place] look at the blister if he's concerned.  ?

## 2021-12-25 ENCOUNTER — Telehealth: Payer: Self-pay

## 2021-12-25 NOTE — Telephone Encounter (Signed)
Ok to provide work note? Please advice?  ?

## 2021-12-25 NOTE — Telephone Encounter (Signed)
Please advise the patient to take days off as needed, but no medical indication for the note requested.

## 2021-12-25 NOTE — Telephone Encounter (Signed)
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 ? ? ?

## 2021-12-26 NOTE — Telephone Encounter (Signed)
Spoke to patient to let him know, stated understanding.  ?

## 2022-02-04 NOTE — Progress Notes (Deleted)
NEUROLOGY CONSULTATION NOTE  Kyle Gill MRN: [id number] DOB: October 24, 1980  Referring provider: Derwood Kaplan, MD (ED referral) Primary care provider: Gilmore Laroche, FNP  Reason for consult:  Bell's palsy  Assessment/Plan:   ***   Subjective:  Kyle Gill is a 41 year old male with HIV and HTN who presents for right-sided Bell's palsy.  History supplemented by ED note.  On 11/05/2021 he started noticing altered sensation on left side of his face.  He began to notice facial asymmetry.   When trying to smoke, he has to press his lips harder on the cigarette.  Left eye would not close completely.  Taste seemed to have changed as well.  No headache, numbness, speech disturbance, visual disturbance or unilateral numbness or weakness of extremities.  He has history of chronic right ear infections and had recently finished course of antibiotics.  He went to the ED the following day where he was diagnosed with Bell's palsy and discharged on course of valacyclovir and prednisone.       PAST MEDICAL HISTORY: Past Medical History:  Diagnosis Date   Bell's palsy    HIV positive (HCC)    Hypertension    Pneumonia     PAST SURGICAL HISTORY: Past Surgical History:  Procedure Laterality Date   SHOULDER ARTHROSCOPY WITH LABRAL REPAIR Right 06/13/2016   Procedure: SHOULDER DIAGNOSTIC OPERATIVE ARTHROSCOPY WITH LABRAL REPAIR;  Surgeon: Cammy Copa, MD;  Location: [facility] OR;  Service: Orthopedics;  Laterality: Right;    MEDICATIONS: Current Outpatient Medications on File Prior to Visit  Medication Sig Dispense Refill   Cholecalciferol (VITAMIN D3) 125 MCG (5000 UT) CAPS Take 1 capsule (5,000 Units total) by mouth daily. 30 capsule 3   clonazePAM (KLONOPIN) 0.5 MG tablet Take 1 tablet (0.5 mg total) by mouth 2 (two) times daily as needed for anxiety. 20 tablet 1   doxycycline (VIBRAMYCIN) 100 MG capsule Take 1 capsule (100 mg total) by mouth 2 (two) times daily. (Patient not taking: Reported  on 12/04/2021) 20 capsule 0   gabapentin (NEURONTIN) 300 MG capsule Take 1 capsule (300 mg total) by mouth at bedtime. (Patient not taking: Reported on 12/04/2021) 30 capsule 0   HYDROcodone-acetaminophen (NORCO/VICODIN) 5-325 MG tablet Take one tab po q 4 hrs prn pain (Patient not taking: Reported on 12/04/2021) 10 tablet 0   hydroxypropyl methylcellulose / hypromellose (ISOPTO TEARS / GONIOVISC) 2.5 % ophthalmic solution Place 1 drop into the left eye 4 (four) times daily as needed for dry eyes. 15 mL 0   lisinopril-hydrochlorothiazide (ZESTORETIC) 10-12.5 MG tablet Take 1 tablet by mouth daily. (Patient not taking: Reported on 12/04/2021)     olmesartan (BENICAR) 20 MG tablet Take 1 tablet (20 mg total) by mouth daily. 90 tablet 1   predniSONE (DELTASONE) 10 MG tablet Take 5 tablets (50 mg total) by mouth daily. (Patient not taking: Reported on 12/04/2021) 20 tablet 0   No current facility-administered medications on file prior to visit.    ALLERGIES: Allergies  Allergen Reactions   Aspirin Itching and Other (See Comments)    Dry mouth   Penicillins Itching and Other (See Comments)    Inflamed skin around genitalia   Sulfamethoxazole-Trimethoprim Itching and Other (See Comments)    Inflamed skin around genitalia   Chlorhexidine Hives    FAMILY HISTORY: No family history on file.  Objective:  *** General: No acute distress.  Patient appears well-groomed.   Head:  Normocephalic/atraumatic Eyes:  fundi examined but not visualized Neck: supple, no  paraspinal tenderness, full range of motion Back: No paraspinal tenderness Heart: regular rate and rhythm Lungs: Clear to auscultation bilaterally. Vascular: No carotid bruits. Neurological Exam: Mental status: alert and oriented to person, place, and time, recent and remote memory intact, fund of knowledge intact, attention and concentration intact, speech fluent and not dysarthric, language intact. Cranial nerves: CN I: not tested CN II:  pupils equal, round and reactive to light, visual fields intact CN III, IV, VI:  full range of motion, no nystagmus, no ptosis CN V: facial sensation intact. CN VII: upper and lower face symmetric CN VIII: hearing intact CN IX, X: gag intact, uvula midline CN XI: sternocleidomastoid and trapezius muscles intact CN XII: tongue midline Bulk & Tone: normal, no fasciculations. Motor:  muscle strength 5/5 throughout Sensation:  Pinprick, temperature and vibratory sensation intact. Deep Tendon Reflexes:  2+ throughout,  toes downgoing.   Finger to nose testing:  Without dysmetria.   Heel to shin:  Without dysmetria.   Gait:  Normal station and stride.  Romberg negative.    Thank you for allowing me to take part in the care of this patient.  Shon Millet, DO  CC: Gilmore Laroche, FNP

## 2022-02-05 ENCOUNTER — Ambulatory Visit: Payer: 59 | Admitting: Neurology

## 2022-02-05 ENCOUNTER — Encounter: Payer: Self-pay | Admitting: Neurology

## 2022-02-05 DIAGNOSIS — Z029 Encounter for administrative examinations, unspecified: Secondary | ICD-10-CM

## 2022-03-06 ENCOUNTER — Ambulatory Visit (INDEPENDENT_AMBULATORY_CARE_PROVIDER_SITE_OTHER): Payer: 59 | Admitting: Family Medicine

## 2022-03-06 ENCOUNTER — Telehealth: Payer: Self-pay | Admitting: Family Medicine

## 2022-03-06 ENCOUNTER — Encounter: Payer: Self-pay | Admitting: Family Medicine

## 2022-03-06 VITALS — BP 133/79 | HR 96 | Ht 75.0 in | Wt 220.0 lb

## 2022-03-06 DIAGNOSIS — F17210 Nicotine dependence, cigarettes, uncomplicated: Secondary | ICD-10-CM | POA: Diagnosis not present

## 2022-03-06 DIAGNOSIS — Z0001 Encounter for general adult medical examination with abnormal findings: Secondary | ICD-10-CM | POA: Insufficient documentation

## 2022-03-06 DIAGNOSIS — B20 Human immunodeficiency virus [HIV] disease: Secondary | ICD-10-CM

## 2022-03-06 NOTE — Patient Instructions (Addendum)
I appreciate the opportunity to provide care to you today!    Follow up: 1 week   I recommend taking GasX OTC for bloating   Please continue to a heart-healthy diet and increase your physical activities. Try to exercise for at least three times a week.      It was a pleasure to see you and I look forward to continuing to work together on your health and well-being. Please do not hesitate to call the office if you need care or have questions about your care.   Have a wonderful day and week. With Gratitude, Gilmore Laroche MSN, FNP-BC

## 2022-03-06 NOTE — Assessment & Plan Note (Signed)
Patient Counseling: --Nutrition: Stressed importance of moderation in sodium/caffeine intake, saturated fat and cholesterol, caloric balance, sufficient intake of fresh fruits, vegetables, fiber, calcium, iron, and 1 mg of folate supplement per day (for females capable of pregnancy). --Exercise: Stressed the importance of regular exercise.  Walks 4 miles daily --Substance Abuse: drinks alcohol twice a  week, at most 6 pack daily, reporting to drink at most 2 (24 ounce cane) Discussed cessation/primary prevention of tobacco, alcohol, or other drug use; driving or other dangerous activities under the influence; availability of treatment for abuse.  --Sexuality: Discussed sexually transmitted diseases, partner selection, use of condoms, avoidance of unintended pregnancy and contraceptive alternatives.  --Injury prevention: Discussed safety belts, safety helmets, smoke detector, smoking near bedding or upholstery.  --Dental health: Discussed importance of regular tooth brushing, flossing, and dental visits. --Immunizations reviewed, pt needs his TDAP vaccine.  --Discussed benefits of screening colonoscopy. --After hours service discussed with patient

## 2022-03-06 NOTE — Telephone Encounter (Signed)
Pt informed

## 2022-03-06 NOTE — Assessment & Plan Note (Signed)
Smokes about 1/2 pack/day  Asked about quitting: confirms that he currently smokes cigarettes Advise to quit smoking: Educated about QUITTING to reduce the risk of cancer, cardio and cerebrovascular disease. Assess willingness: willing to quit at this time and  is working on cutting back. Assist with counseling and pharmacotherapy: Counseled for 5 minutes and literature provided. Arrange for follow up: follow up in 3 months and continue to offer help.

## 2022-03-06 NOTE — Progress Notes (Signed)
new
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
## 2022-03-06 NOTE — Assessment & Plan Note (Addendum)
-  C/o of  painful blisters on hands and feet   -C/o of sharp pain at the tip of the fingers -C/o of sores on the hands  -Onset since April 2023 -C/o of feeling bloated with no changes in BM -Denies nausea, vomiting, and diarrhea -Last CD4 count was 03/12/2019  -Currently not taking ART -Will refer pt to infectious diseases to r/o opportunistic infections -Recommended OTC GASX for relief of bloating

## 2022-03-13 ENCOUNTER — Encounter: Payer: Self-pay | Admitting: Family Medicine

## 2022-03-13 ENCOUNTER — Ambulatory Visit (INDEPENDENT_AMBULATORY_CARE_PROVIDER_SITE_OTHER): Payer: 59 | Admitting: Family Medicine

## 2022-03-13 DIAGNOSIS — B20 Human immunodeficiency virus [HIV] disease: Secondary | ICD-10-CM

## 2022-03-13 DIAGNOSIS — R238 Other skin changes: Secondary | ICD-10-CM | POA: Diagnosis not present

## 2022-03-13 MED ORDER — MUPIROCIN 2 % EX OINT: 1.0000 | TOPICAL_OINTMENT | Freq: Two times a day (BID) | CUTANEOUS | 0 refills | Status: DC

## 2022-03-13 NOTE — Progress Notes (Signed)
Virtual Visit via Telephone Note   This visit type was conducted due to national recommendations for restrictions regarding the COVID-19 Pandemic (e.g. social distancing) in an effort to limit this patient's exposure and mitigate transmission in our community.  Due to his co-morbid illnesses, this patient is at least at moderate risk for complications without adequate follow up.  This format is felt to be most appropriate for this patient at this time.  The patient did not have access to video technology/had technical difficulties with video requiring transitioning to audio format only (telephone).  All issues noted in this document were discussed and addressed.  No physical exam could be performed with this format.  Please refer to the patient's chart for his  consent to telehealth for [facility].   Evaluation Performed:  Follow-up visit  Date:  03/13/2022   ID:  Kyle Gill, DOB October 22, 1980, MRN [id number]  Patient Location: Home Provider Location: Office/Clinic  Participants: Patient Location of Patient: Home Location of Provider: Telehealth Consent was obtain for visit to be over via telehealth. I verified that I am speaking with the correct person using two identifiers.  PCP:  Gilmore Laroche, FNP   Chief Complaint:  blisters on hands and feet  History of Present Illness:    Kyle Gill is a 41 y.o. male with c/o of hands and feet blisters. Onset of symptoms is about 2 months. He reports pain and itching with blisters.   The patient does not have symptoms concerning for COVID-19 infection (fever, chills, cough, or new shortness of breath).   Past Medical, Surgical, Social History, Allergies, and Medications have been Reviewed.  Past Medical History:  Diagnosis Date   Bell's palsy    HIV positive (HCC)    Hypertension    Pneumonia    Past Surgical History:  Procedure Laterality Date   SHOULDER ARTHROSCOPY WITH LABRAL REPAIR Right 06/13/2016   Procedure: SHOULDER  DIAGNOSTIC OPERATIVE ARTHROSCOPY WITH LABRAL REPAIR;  Surgeon: Cammy Copa, MD;  Location: [facility] OR;  Service: Orthopedics;  Laterality: Right;     Current Meds  Medication Sig   Cholecalciferol (VITAMIN D3) 125 MCG (5000 UT) CAPS Take 1 capsule (5,000 Units total) by mouth daily.   clonazePAM (KLONOPIN) 0.5 MG tablet Take 1 tablet (0.5 mg total) by mouth 2 (two) times daily as needed for anxiety.   doxycycline (VIBRAMYCIN) 100 MG capsule Take 1 capsule (100 mg total) by mouth 2 (two) times daily.   gabapentin (NEURONTIN) 300 MG capsule Take 1 capsule (300 mg total) by mouth at bedtime.   HYDROcodone-acetaminophen (NORCO/VICODIN) 5-325 MG tablet Take one tab po q 4 hrs prn pain   hydroxypropyl methylcellulose / hypromellose (ISOPTO TEARS / GONIOVISC) 2.5 % ophthalmic solution Place 1 drop into the left eye 4 (four) times daily as needed for dry eyes.   lisinopril-hydrochlorothiazide (ZESTORETIC) 10-12.5 MG tablet Take 1 tablet by mouth daily.   mupirocin ointment (BACTROBAN) 2 % Apply 1 Application topically 2 (two) times daily.   olmesartan (BENICAR) 20 MG tablet Take 1 tablet (20 mg total) by mouth daily.   predniSONE (DELTASONE) 10 MG tablet Take 5 tablets (50 mg total) by mouth daily.     Allergies:   Aspirin, Penicillins, Sulfamethoxazole-trimethoprim, and Chlorhexidine   ROS:   Please see the history of present illness.     All other systems reviewed and are negative.   Labs/Other Tests and Data Reviewed:    Recent Labs: 11/05/2021: BUN 7; Creatinine, Ser 0.99; Hemoglobin  17.8; Platelets 244; Potassium 4.0; Sodium 137 12/05/2021: TSH CANCELED   Recent Lipid Panel Lab Results  Component Value Date/Time   CHOL 172 12/05/2021 08:24 AM   TRIG 214 (H) 12/05/2021 08:24 AM   HDL 40 12/05/2021 08:24 AM   CHOLHDL 4.3 12/05/2021 08:24 AM   LDLCALC 95 12/05/2021 08:24 AM    Wt Readings from Last 3 Encounters:  03/06/22 220 lb (99.8 kg)  12/04/21 217 lb 3.2 oz (98.5 kg)   08/28/21 215 lb (97.5 kg)     Objective:    Vital Signs:  There were no vitals taken for this visit.     ASSESSMENT & PLAN:   Multiple blisters -will refer pt to dermatology for blisters of the hands and feet -Bactroban ordered -last CD4 count was in 2020 -pt state that he doesn't want to be placed on medications for HIV -declines referral to infectious disease -will come to the office to get labs   Time:   Today, I have spent 8 minutes reviewing the chart, including problem list, medications, and with the patient with telehealth technology discussing the above problems.   Medication Adjustments/Labs and Tests Ordered: Current medicines are reviewed at length with the patient today.  Concerns regarding medicines are outlined above.   Tests Ordered: Orders Placed This Encounter  Procedures   Ambulatory referral to Dermatology    Medication Changes: Meds ordered this encounter  Medications   mupirocin ointment (BACTROBAN) 2 %    Sig: Apply 1 Application topically 2 (two) times daily.    Dispense:  22 g    Refill:  0     Note: This dictation was prepared with Dragon dictation along with smaller phrase technology. Similar sounding words can be transcribed inadequately or may not be corrected upon review. Any transcriptional errors that result from this process are unintentional.      Disposition:  Follow up  Signed, Gilmore Laroche, FNP  03/13/2022 1:03 PM     [facility]

## 2022-06-28 NOTE — Addendum Note (Signed)
Addended by: Canden Cieslinski on: 06/28/2022 06:01 PM   Modules accepted: Level of Service  

## 2022-08-02 ENCOUNTER — Other Ambulatory Visit: Payer: Self-pay | Admitting: Family Medicine

## 2022-08-02 DIAGNOSIS — I10 Essential (primary) hypertension: Secondary | ICD-10-CM

## 2022-08-14 ENCOUNTER — Ambulatory Visit (INDEPENDENT_AMBULATORY_CARE_PROVIDER_SITE_OTHER): Payer: 59 | Admitting: Internal Medicine

## 2022-08-14 ENCOUNTER — Encounter: Payer: Self-pay | Admitting: Internal Medicine

## 2022-08-14 VITALS — Temp 97.2°F

## 2022-08-14 DIAGNOSIS — J069 Acute upper respiratory infection, unspecified: Secondary | ICD-10-CM

## 2022-08-14 DIAGNOSIS — B20 Human immunodeficiency virus [HIV] disease: Secondary | ICD-10-CM

## 2022-08-14 MED ORDER — AZITHROMYCIN 250 MG PO TABS: ORAL_TABLET | ORAL | 0 refills | Status: AC

## 2022-08-14 NOTE — Progress Notes (Signed)
Virtual Visit via Telephone Note   This visit type was conducted via telephone. This format is felt to be most appropriate for this patient at this time.  The patient did not have access to video technology/had technical difficulties with video requiring transitioning to audio format only (telephone).  All issues noted in this document were discussed and addressed.  No physical exam could be performed with this format.  Evaluation Performed:  Follow-up visit  Date:  08/14/2022   ID:  Kyle Gill, DOB 29-Jan-1981, MRN [id number]  Patient Location: Home Provider Location: Office/Clinic  Participants: Patient Location of Patient: Home Location of Provider: Telehealth Consent was obtain for visit to be over via telehealth. I verified that I am speaking with the correct person using two identifiers.  PCP:  Gilmore Laroche, FNP   Chief Complaint: Cough, nasal congestion and headache  History of Present Illness:    Kyle Gill is a 41 y.o. male who has a televisit for complaint of cough, nasal congestion and sinus pressure related headache for the last 3 days.  He had a negative COVID test at home yesterday.  He denies any fever or chills.  Denies any dyspnea or wheezing currently.  He has had cough with greenish-yellow expectoration.  Of note, he has history of HIV and is currently not taking ART.  He works at a senior living facility.  The patient does not have symptoms concerning for COVID-19 infection (fever, chills, cough, or new shortness of breath).   Past Medical, Surgical, Social History, Allergies, and Medications have been Reviewed.  Past Medical History:  Diagnosis Date   Bell's palsy    HIV positive (HCC)    Hypertension    Pneumonia    Past Surgical History:  Procedure Laterality Date   SHOULDER ARTHROSCOPY WITH LABRAL REPAIR Right 06/13/2016   Procedure: SHOULDER DIAGNOSTIC OPERATIVE ARTHROSCOPY WITH LABRAL REPAIR;  Surgeon: Cammy Copa, MD;  Location: [facility]  OR;  Service: Orthopedics;  Laterality: Right;     No outpatient medications have been marked as taking for the 08/14/22 encounter (Office Visit) with Anabel Halon, MD.     Allergies:   Aspirin, Penicillins, Sulfamethoxazole-trimethoprim, and Chlorhexidine   ROS:   Please see the history of present illness.     All other systems reviewed and are negative.   Labs/Other Tests and Data Reviewed:    Recent Labs: 11/05/2021: BUN 7; Creatinine, Ser 0.99; Hemoglobin 17.8; Platelets 244; Potassium 4.0; Sodium 137 12/05/2021: TSH CANCELED   Recent Lipid Panel Lab Results  Component Value Date/Time   CHOL 172 12/05/2021 08:24 AM   TRIG 214 (H) 12/05/2021 08:24 AM   HDL 40 12/05/2021 08:24 AM   CHOLHDL 4.3 12/05/2021 08:24 AM   LDLCALC 95 12/05/2021 08:24 AM    Wt Readings from Last 3 Encounters:  03/06/22 220 lb (99.8 kg)  12/04/21 217 lb 3.2 oz (98.5 kg)  08/28/21 215 lb (97.5 kg)     ASSESSMENT & PLAN:    URTI HIV Considering his risk factor for opportunistic infections, started empiric azithromycin Mucinex DM max as needed for cough Nasal saline spray as needed for nasal congestion Advised to consider seeing infectious disease clinic and starting ART  Time:   Today, I have spent 12 minutes reviewing the chart, including problem list, medications, and with the patient with telehealth technology discussing the above problems.   Medication Adjustments/Labs and Tests Ordered: Current medicines are reviewed at length with the patient today.  Concerns regarding medicines  are outlined above.   Tests Ordered: No orders of the defined types were placed in this encounter.   Medication Changes: No orders of the defined types were placed in this encounter.    Note: This dictation was prepared with Dragon dictation along with smaller phrase technology. Similar sounding words can be transcribed inadequately or may not be corrected upon review. Any transcriptional errors that  result from this process are unintentional.      Disposition:  Follow up  Signed, Anabel Halon, MD  08/14/2022 11:57 AM     [facility]

## 2022-08-14 NOTE — Patient Instructions (Addendum)
Please take Azithromycin as prescribed.  Please take Mucinex DM as needed for cough.  Please use nasal saline spray for nasal congestion.

## 2022-09-30 ENCOUNTER — Telehealth (INDEPENDENT_AMBULATORY_CARE_PROVIDER_SITE_OTHER): Payer: 59 | Admitting: Internal Medicine

## 2022-09-30 ENCOUNTER — Encounter: Payer: Self-pay | Admitting: Internal Medicine

## 2022-09-30 DIAGNOSIS — U071 COVID-19: Secondary | ICD-10-CM | POA: Diagnosis not present

## 2022-09-30 NOTE — Assessment & Plan Note (Addendum)
Positive home Covid test. Only having a headache and malaise at this time. - Recommend Mucinex for congestion, dextromethorphan for cough.  - Discussed HIV status and possible risk for opportunistic infections, however I can not make further recommendations without labs work.  - Follow up if symptoms worsen or fail to improve

## 2022-09-30 NOTE — Patient Instructions (Addendum)
Thank you for trusting me with your care. To recap, today we discussed the following:   - If you develop congestion this symptom can be treated with Mucinex. Dextromethorphan will help with cough. Tylenol for headache and body aches. Follow up if symptoms worsen or fail to improve  - Follow CDC recommendations for isolation

## 2022-09-30 NOTE — Progress Notes (Signed)
   Virtual Visit via Video Note  I connected with Kyle Gill on 09/30/22 at  8:20 AM EST by a video enabled telemedicine application and verified that I am speaking with the correct person using two identifiers.  Patient Location: Home Provider Location: Office/Clinic  I discussed the limitations, risks, security, and privacy concerns of performing an evaluation and management service by video and the availability of in person appointments. I also discussed with the patient that there may be a patient responsible charge related to this service. The patient expressed understanding and agreed to proceed.  Subjective: PCP: Alvira Monday, FNP  Yesterday patient started to have a headache and malaise. 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  No SOB.  No Covid-19 vaccinations. He has history of HIV and has decided not to undergo treatment. Last CD4 count and HIV viral load checked 3 years ago.   ROS: Per HPI  Current Outpatient Medications:    Cholecalciferol (VITAMIN D3) 125 MCG (5000 UT) CAPS, Take 1 capsule (5,000 Units total) by mouth daily., Disp: 30 capsule, Rfl: 3   clonazePAM (KLONOPIN) 0.5 MG tablet, Take 1 tablet (0.5 mg total) by mouth 2 (two) times daily as needed for anxiety., Disp: 20 tablet, Rfl: 1   hydroxypropyl methylcellulose / hypromellose (ISOPTO TEARS / GONIOVISC) 2.5 % ophthalmic solution, Place 1 drop into the left eye 4 (four) times daily as needed for dry eyes., Disp: 15 mL, Rfl: 0   mupirocin ointment (BACTROBAN) 2 %, Apply 1 Application topically 2 (two) times daily., Disp: 22 g, Rfl: 0   olmesartan (BENICAR) 20 MG tablet, TAKE 1 TABLET BY MOUTH EVERY DAY, Disp: 90 tablet, Rfl: 1  Observations/Objective: General: Not ill appearing . No cough or congestion on video call. Normal work of breathing and talking in completes sentences.   Assessment and Plan: COVID-19 Assessment &  Plan: Positive home Covid test. Only having a headache and malaise at this time. - Recommend Mucinex for congestion, dextromethorphan for cough.  - Discussed HIV status and possible risk for opportunistic infections, however I can not make further recommendations without labs work.  - Follow up if symptoms worsen or fail to improve     Follow Up Instructions: Return if symptoms worsen or fail to improve.   I discussed the assessment and treatment plan with the patient. The patient was provided an opportunity to ask questions, and all were answered. The patient agreed with the plan and demonstrated an understanding of the instructions.   The patient was advised to call back or seek an in-person evaluation if the symptoms worsen or if the condition fails to improve as anticipated.  The above assessment and management plan was discussed with the patient. The patient verbalized understanding of and has agreed to the management plan.   Lorene Dy, MD

## 2022-10-30 ENCOUNTER — Other Ambulatory Visit: Payer: Self-pay | Admitting: Family Medicine

## 2022-10-30 DIAGNOSIS — F419 Anxiety disorder, unspecified: Secondary | ICD-10-CM

## 2022-11-05 ENCOUNTER — Other Ambulatory Visit: Payer: Self-pay | Admitting: Family Medicine

## 2022-11-05 DIAGNOSIS — F419 Anxiety disorder, unspecified: Secondary | ICD-10-CM

## 2022-12-31 ENCOUNTER — Ambulatory Visit (HOSPITAL_COMMUNITY)
Admission: RE | Admit: 2022-12-31 | Discharge: 2022-12-31 | Disposition: A | Discharge status: Home / Self Care | Payer: 59 | Source: Ambulatory Visit | Attending: Internal Medicine | Admitting: Internal Medicine

## 2022-12-31 ENCOUNTER — Ambulatory Visit (INDEPENDENT_AMBULATORY_CARE_PROVIDER_SITE_OTHER): Payer: 59 | Admitting: Internal Medicine

## 2022-12-31 ENCOUNTER — Encounter: Payer: Self-pay | Admitting: Internal Medicine

## 2022-12-31 VITALS — BP 151/96 | HR 64 | Ht 75.0 in | Wt 224.0 lb

## 2022-12-31 DIAGNOSIS — M25551 Pain in right hip: Secondary | ICD-10-CM

## 2022-12-31 MED ORDER — MELOXICAM 7.5 MG PO TABS: 7.5000 mg | ORAL_TABLET | Freq: Every day | ORAL | 0 refills | Status: DC

## 2022-12-31 NOTE — Patient Instructions (Signed)
Thank you, Mr.Lucius Emig for allowing [place] to provide your care today.   Go to [facility] for xray of hip Mobic sent to your pharmacy I will follow up results    Thurmon Fair, M.D.
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
## 2023-01-02 ENCOUNTER — Telehealth: Payer: Self-pay | Admitting: Family Medicine

## 2023-01-02 NOTE — Telephone Encounter (Signed)
Disability forms  Noted  Copied Sleeved  Original in pcp box Copy front desk

## 2023-01-09 ENCOUNTER — Other Ambulatory Visit: Payer: Self-pay

## 2023-01-20 ENCOUNTER — Telehealth: Payer: Self-pay | Admitting: Family Medicine

## 2023-01-20 NOTE — Telephone Encounter (Signed)
FMLA forms  Copied Noted Sleeved  Call patient when ready and fax forms

## 2023-02-02 DIAGNOSIS — Z0279 Encounter for issue of other medical certificate: Secondary | ICD-10-CM

## 2023-02-04 NOTE — Telephone Encounter (Signed)
Paperwork faxed with confirmation on 6/3  Form fee pending - Provider did not sign   Returned to provider box for signature and refax

## 2023-02-05 NOTE — Telephone Encounter (Signed)
Forms faxed by CMA , confirmation received.  Pt aware forms faxed and copy ready for pick up.   Copy added to scans folder.

## 2023-02-07 NOTE — Telephone Encounter (Signed)
Patient called left a voicemail needs FMLA question # 5 must be completed.  Copied Noted Sleeved  Put copy in providers box call patient when ready and fax to [phone].

## 2023-02-24 NOTE — Progress Notes (Unsigned)
Established Patient Office Visit  Subjective:  Patient ID: Kyle Gill, male    DOB: December 25, 1980  Age: 42 y.o. MRN: [id number]  CC: No chief complaint on file.   HPI Kyle Gill is a 42 y.o. male with past medical history of *** presents for f/u of *** chronic medical conditions.  hematochezia (passage of maroon or bright red blood or blood clots per rectum). Blood originating from the left colon tends to be bright red in color, whereas bleeding from the right side of the colon usually appears dark or maroon colored and may be mixed with stool. Rarely, bleeding from the right side of the colon will present with melena  Past Medical History:  Diagnosis Date   Bell's palsy    HIV positive (HCC)    Hypertension    Pneumonia     Past Surgical History:  Procedure Laterality Date   SHOULDER ARTHROSCOPY WITH LABRAL REPAIR Right 06/13/2016   Procedure: SHOULDER DIAGNOSTIC OPERATIVE ARTHROSCOPY WITH LABRAL REPAIR;  Surgeon: Cammy Copa, MD;  Location: [facility] OR;  Service: Orthopedics;  Laterality: Right;    No family history on file.  Social History   Socioeconomic History   Marital status: Single    Spouse name: Not on file   Number of children: Not on file   Years of education: Not on file   Highest education level: Not on file  Occupational History   Not on file  Tobacco Use   Smoking status: Every Day    Packs/day: .5    Types: Cigarettes   Smokeless tobacco: Never  Vaping Use   Vaping Use: Never used  Substance and Sexual Activity   Alcohol use: Yes    Comment: occasionally   Drug use: Yes    Types: Marijuana   Sexual activity: Yes    Birth control/protection: None  Other Topics Concern   Not on file  Social History Narrative   Not on file   Social Determinants of Health   Financial Resource Strain: Not on file  Food Insecurity: Not on file  Transportation Needs: Not on file  Physical Activity: Not on file  Stress: Not on file  Social Connections: Not  on file  Intimate Partner Violence: Not on file    Outpatient Medications Prior to Visit  Medication Sig Dispense Refill   Cholecalciferol (VITAMIN D3) 125 MCG (5000 UT) CAPS Take 1 capsule (5,000 Units total) by mouth daily. 30 capsule 3   clonazePAM (KLONOPIN) 0.5 MG tablet Take 1 tablet (0.5 mg total) by mouth 2 (two) times daily as needed for anxiety. 20 tablet 1   hydroxypropyl methylcellulose / hypromellose (ISOPTO TEARS / GONIOVISC) 2.5 % ophthalmic solution Place 1 drop into the left eye 4 (four) times daily as needed for dry eyes. 15 mL 0   meloxicam (MOBIC) 7.5 MG tablet Take 1 tablet (7.5 mg total) by mouth daily. 7 tablet 0   mupirocin ointment (BACTROBAN) 2 % Apply 1 Application topically 2 (two) times daily. 22 g 0   olmesartan (BENICAR) 20 MG tablet TAKE 1 TABLET BY MOUTH EVERY DAY 90 tablet 1   No facility-administered medications prior to visit.    Allergies  Allergen Reactions   Aspirin Itching and Other (See Comments)    Dry mouth   Penicillins Itching and Other (See Comments)    Inflamed skin around genitalia   Sulfamethoxazole-Trimethoprim Itching and Other (See Comments)    Inflamed skin around genitalia   Chlorhexidine Hives    ROS Review  of Systems    Objective:    Physical Exam  There were no vitals taken for this visit. Wt Readings from Last 3 Encounters:  12/31/22 224 lb (101.6 kg)  03/06/22 220 lb (99.8 kg)  12/04/21 217 lb 3.2 oz (98.5 kg)    Lab Results  Component Value Date   TSH CANCELED 12/05/2021   Lab Results  Component Value Date   WBC 7.1 11/05/2021   HGB 17.8 (H) 11/05/2021   HCT 50.4 11/05/2021   MCV 88.7 11/05/2021   PLT 244 11/05/2021   Lab Results  Component Value Date   NA 137 11/05/2021   K 4.0 11/05/2021   CO2 25 11/05/2021   GLUCOSE 88 11/05/2021   BUN 7 11/05/2021   CREATININE 0.99 11/05/2021   BILITOT 0.8 06/05/2016   ALKPHOS 79 06/05/2016   AST 35 06/05/2016   ALT 34 06/05/2016   PROT 7.5 06/05/2016    ALBUMIN 4.1 06/05/2016   CALCIUM 9.1 11/05/2021   ANIONGAP 8 11/05/2021   Lab Results  Component Value Date   CHOL 172 12/05/2021   Lab Results  Component Value Date   HDL 40 12/05/2021   Lab Results  Component Value Date   LDLCALC 95 12/05/2021   Lab Results  Component Value Date   TRIG 214 (H) 12/05/2021   Lab Results  Component Value Date   CHOLHDL 4.3 12/05/2021   Lab Results  Component Value Date   HGBA1C 5.9 (H) 12/05/2021      Assessment & Plan:  There are no diagnoses linked to this encounter.  Follow-up: No follow-ups on file.   Gilmore Laroche, FNP

## 2023-02-25 ENCOUNTER — Encounter: Payer: Self-pay | Admitting: Family Medicine

## 2023-02-25 ENCOUNTER — Ambulatory Visit (INDEPENDENT_AMBULATORY_CARE_PROVIDER_SITE_OTHER): Payer: 59 | Admitting: Family Medicine

## 2023-02-25 VITALS — BP 142/88 | HR 81 | Ht 75.0 in | Wt 215.1 lb

## 2023-02-25 DIAGNOSIS — F419 Anxiety disorder, unspecified: Secondary | ICD-10-CM | POA: Diagnosis not present

## 2023-02-25 DIAGNOSIS — I10 Essential (primary) hypertension: Secondary | ICD-10-CM

## 2023-02-25 DIAGNOSIS — R110 Nausea: Secondary | ICD-10-CM | POA: Diagnosis not present

## 2023-02-25 DIAGNOSIS — K921 Melena: Secondary | ICD-10-CM | POA: Diagnosis not present

## 2023-02-25 MED ORDER — CLONAZEPAM 0.5 MG PO TABS: 0.5000 mg | ORAL_TABLET | Freq: Two times a day (BID) | ORAL | 0 refills | Status: DC | PRN

## 2023-02-25 MED ORDER — OLMESARTAN MEDOXOMIL 20 MG PO TABS: 20.0000 mg | ORAL_TABLET | Freq: Every day | ORAL | 1 refills | Status: DC

## 2023-02-25 MED ORDER — ONDANSETRON HCL 4 MG PO TABS: 4.0000 mg | ORAL_TABLET | Freq: Three times a day (TID) | ORAL | 0 refills | Status: AC | PRN

## 2023-02-25 NOTE — Assessment & Plan Note (Addendum)
Duration of symptoms for 3 days Symptoms started last week No rectal bleeding since Reports rectal bleeding with each BM No reports of constipation or straining with BM No systemic symptoms reported No symptoms of fatigue, lightheadedness or dizziness Reports family history( first degree relative) of colon cancer He noted unintentional weight loss of 7 pounds No abdominal pain reported No pus or mucus in the stool No recent hemorrhoids reported No history of IBD Referral placed to GI

## 2023-02-25 NOTE — Addendum Note (Signed)
Addended byGilmore Laroche on: 02/25/2023 07:50 PM   Modules accepted: Orders

## 2023-02-25 NOTE — Patient Instructions (Addendum)
I appreciate the opportunity to provide care to you today!    Follow up:  3 months  Labs: please stop by the lab today to get your blood drawn (CBC, PTT/PT)   Please pick up your medications at the pharmacy  Referrals today-  integrated behavioral health for talk therapy   Please continue to a heart-healthy diet and increase your physical activities. Try to exercise for at least five days a week.      It was a pleasure to see you and I look forward to continuing to work together on your health and well-being. Please do not hesitate to call the office if you need care or have questions about your care.   Have a wonderful day and week. With Gratitude, Gilmore Laroche MSN, FNP-BC

## 2023-02-25 NOTE — Assessment & Plan Note (Signed)
Uncontrolled Reports not taking  olmesartan 20 mg today Asymptomatic  Refill sent to the pharmacy

## 2023-02-25 NOTE — Assessment & Plan Note (Addendum)
Reports increased life stresses GAD 7 is 5 Would like to speak with a therapist virtually Discussed mindfulness, deep breathing exercises and mediatation Referral placed

## 2023-02-26 LAB — CBC
Hematocrit: 53.7 % — ABNORMAL HIGH (ref 37.5–51.0)
Hemoglobin: 18.4 g/dL — ABNORMAL HIGH (ref 13.0–17.7)
MCH: 31 pg (ref 26.6–33.0)
MCHC: 34.3 g/dL (ref 31.5–35.7)
MCV: 91 fL (ref 79–97)
Platelets: 243 10*3/uL (ref 150–450)
RBC: 5.93 x10E6/uL — ABNORMAL HIGH (ref 4.14–5.80)
RDW: 13.4 % (ref 11.6–15.4)
WBC: 4.4 10*3/uL (ref 3.4–10.8)

## 2023-02-26 LAB — PT AND PTT
INR: 0.9 (ref 0.9–1.2)
Prothrombin Time: 10.3 s (ref 9.1–12.0)
aPTT: 26 s (ref 24–33)

## 2023-02-26 NOTE — Progress Notes (Signed)
Please inform the patient hat his cbc shows no evidence of anemia. His H&H are slightly elevated which could result from smoking. His clotting factors are stable.

## 2023-03-02 NOTE — Progress Notes (Unsigned)
Referring Provider: Gilmore Laroche, FNP Primary Care Physician:  Gilmore Laroche, FNP Primary Gastroenterologist:  Dr. Jena Gauss  No chief complaint on file.   HPI:   Kyle Gill is a 42 y.o. male with history of HIV not on medications, HTN, anxiety, presenting today at the request of Gilmore Laroche, FNP for hematochezia.  Reviewed office visit with Gilmore Laroche, FNP dated 02/25/2023.  Patient reported 3 days of painless rectal bleeding that had resolved by the end of the office visit.  Labs were ordered and showed a hemoglobin of 18.4, INR within normal limits.  He was referred to GI.  Today:   Past Medical History:  Diagnosis Date   Bell's palsy    HIV positive (HCC)    Hypertension    Pneumonia     Past Surgical History:  Procedure Laterality Date   SHOULDER ARTHROSCOPY WITH LABRAL REPAIR Right 06/13/2016   Procedure: SHOULDER DIAGNOSTIC OPERATIVE ARTHROSCOPY WITH LABRAL REPAIR;  Surgeon: Cammy Copa, MD;  Location: [facility] OR;  Service: Orthopedics;  Laterality: Right;    Current Outpatient Medications  Medication Sig Dispense Refill   Cholecalciferol (VITAMIN D3) 125 MCG (5000 UT) CAPS Take 1 capsule (5,000 Units total) by mouth daily. 30 capsule 3   clonazePAM (KLONOPIN) 0.5 MG tablet Take 1 tablet (0.5 mg total) by mouth 2 (two) times daily as needed for anxiety. 20 tablet 0   hydroxypropyl methylcellulose / hypromellose (ISOPTO TEARS / GONIOVISC) 2.5 % ophthalmic solution Place 1 drop into the left eye 4 (four) times daily as needed for dry eyes. 15 mL 0   meloxicam (MOBIC) 7.5 MG tablet Take 1 tablet (7.5 mg total) by mouth daily. 7 tablet 0   mupirocin ointment (BACTROBAN) 2 % Apply 1 Application topically 2 (two) times daily. 22 g 0   olmesartan (BENICAR) 20 MG tablet Take 1 tablet (20 mg total) by mouth daily. 90 tablet 1   ondansetron (ZOFRAN) 4 MG tablet Take 1 tablet (4 mg total) by mouth every 8 (eight) hours as needed for nausea or vomiting. 20 tablet 0    No current facility-administered medications for this visit.    Allergies as of 03/05/2023 - Review Complete 02/25/2023  Allergen Reaction Noted   Aspirin Itching and Other (See Comments)    Penicillins Itching and Other (See Comments)    Sulfamethoxazole-trimethoprim Itching and Other (See Comments) 03/10/2007   Chlorhexidine Hives 01/17/2018    No family history on file.  Social History   Socioeconomic History   Marital status: Single    Spouse name: Not on file   Number of children: Not on file   Years of education: Not on file   Highest education level: Not on file  Occupational History   Not on file  Tobacco Use   Smoking status: Every Day    Packs/day: .5    Types: Cigarettes   Smokeless tobacco: Never  Vaping Use   Vaping Use: Never used  Substance and Sexual Activity   Alcohol use: Yes    Comment: occasionally   Drug use: Yes    Types: Marijuana   Sexual activity: Yes    Birth control/protection: None  Other Topics Concern   Not on file  Social History Narrative   Not on file   Social Determinants of Health   Financial Resource Strain: Not on file  Food Insecurity: Not on file  Transportation Needs: Not on file  Physical Activity: Not on file  Stress: Not on file  Social Connections: Not  on file  Intimate Partner Violence: Not on file    Review of Systems: Gen: Denies any fever, chills, fatigue, weight loss, lack of appetite.  CV: Denies chest pain, heart palpitations, peripheral edema, syncope.  Resp: Denies shortness of breath at rest or with exertion. Denies wheezing or cough.  GI: Denies dysphagia or odynophagia. Denies jaundice, hematemesis, fecal incontinence. GU : Denies urinary burning, urinary frequency, urinary hesitancy MS: Denies joint pain, muscle weakness, cramps, or limitation of movement.  Derm: Denies rash, itching, dry skin Psych: Denies depression, anxiety, memory loss, and confusion Heme: Denies bruising, bleeding, and  enlarged lymph nodes.  Physical Exam: There were no vitals taken for this visit. General:   Alert and oriented. Pleasant and cooperative. Well-nourished and well-developed.  Head:  Normocephalic and atraumatic. Eyes:  Without icterus, sclera clear and conjunctiva pink.  Ears:  Normal auditory acuity. Lungs:  Clear to auscultation bilaterally. No wheezes, rales, or rhonchi. No distress.  Heart:  S1, S2 present without murmurs appreciated.  Abdomen:  +BS, soft, non-tender and non-distended. No HSM noted. No guarding or rebound. No masses appreciated.  Rectal:  Deferred  Msk:  Symmetrical without gross deformities. Normal posture. Extremities:  Without edema. Neurologic:  Alert and  oriented x4;  grossly normal neurologically. Skin:  Intact without significant lesions or rashes. Psych:  Alert and cooperative. Normal mood and affect.    Assessment:     Plan:  ***   Ermalinda Memos, PA-C [facility] 03/05/2023

## 2023-03-02 NOTE — H&P (View-Only) (Signed)
 Referring Provider: Gilmore Laroche, FNP Primary Care Physician:  Gilmore Laroche, FNP Primary Gastroenterologist:  Dr. Jena Gauss  No chief complaint on file.   HPI:   Kyle Gill is a 42 y.o. male with history of HIV not on medications, HTN, anxiety, presenting today at the request of Gilmore Laroche, FNP for hematochezia.  Reviewed office visit with Gilmore Laroche, FNP dated 02/25/2023.  Patient reported 3 days of painless rectal bleeding that had resolved by the end of the office visit.  Labs were ordered and showed a hemoglobin of 18.4, INR within normal limits.  He was referred to GI.  Today:   Past Medical History:  Diagnosis Date   Bell's palsy    HIV positive (HCC)    Hypertension    Pneumonia     Past Surgical History:  Procedure Laterality Date   SHOULDER ARTHROSCOPY WITH LABRAL REPAIR Right 06/13/2016   Procedure: SHOULDER DIAGNOSTIC OPERATIVE ARTHROSCOPY WITH LABRAL REPAIR;  Surgeon: Cammy Copa, MD;  Location: [facility] OR;  Service: Orthopedics;  Laterality: Right;    Current Outpatient Medications  Medication Sig Dispense Refill   Cholecalciferol (VITAMIN D3) 125 MCG (5000 UT) CAPS Take 1 capsule (5,000 Units total) by mouth daily. 30 capsule 3   clonazePAM (KLONOPIN) 0.5 MG tablet Take 1 tablet (0.5 mg total) by mouth 2 (two) times daily as needed for anxiety. 20 tablet 0   hydroxypropyl methylcellulose / hypromellose (ISOPTO TEARS / GONIOVISC) 2.5 % ophthalmic solution Place 1 drop into the left eye 4 (four) times daily as needed for dry eyes. 15 mL 0   meloxicam (MOBIC) 7.5 MG tablet Take 1 tablet (7.5 mg total) by mouth daily. 7 tablet 0   mupirocin ointment (BACTROBAN) 2 % Apply 1 Application topically 2 (two) times daily. 22 g 0   olmesartan (BENICAR) 20 MG tablet Take 1 tablet (20 mg total) by mouth daily. 90 tablet 1   ondansetron (ZOFRAN) 4 MG tablet Take 1 tablet (4 mg total) by mouth every 8 (eight) hours as needed for nausea or vomiting. 20 tablet 0    No current facility-administered medications for this visit.    Allergies as of 03/05/2023 - Review Complete 02/25/2023  Allergen Reaction Noted   Aspirin Itching and Other (See Comments)    Penicillins Itching and Other (See Comments)    Sulfamethoxazole-trimethoprim Itching and Other (See Comments) 03/10/2007   Chlorhexidine Hives 01/17/2018    No family history on file.  Social History   Socioeconomic History   Marital status: Single    Spouse name: Not on file   Number of children: Not on file   Years of education: Not on file   Highest education level: Not on file  Occupational History   Not on file  Tobacco Use   Smoking status: Every Day    Packs/day: .5    Types: Cigarettes   Smokeless tobacco: Never  Vaping Use   Vaping Use: Never used  Substance and Sexual Activity   Alcohol use: Yes    Comment: occasionally   Drug use: Yes    Types: Marijuana   Sexual activity: Yes    Birth control/protection: None  Other Topics Concern   Not on file  Social History Narrative   Not on file   Social Determinants of Health   Financial Resource Strain: Not on file  Food Insecurity: Not on file  Transportation Needs: Not on file  Physical Activity: Not on file  Stress: Not on file  Social Connections: Not  on file  Intimate Partner Violence: Not on file    Review of Systems: Gen: Denies any fever, chills, fatigue, weight loss, lack of appetite.  CV: Denies chest pain, heart palpitations, peripheral edema, syncope.  Resp: Denies shortness of breath at rest or with exertion. Denies wheezing or cough.  GI: Denies dysphagia or odynophagia. Denies jaundice, hematemesis, fecal incontinence. GU : Denies urinary burning, urinary frequency, urinary hesitancy MS: Denies joint pain, muscle weakness, cramps, or limitation of movement.  Derm: Denies rash, itching, dry skin Psych: Denies depression, anxiety, memory loss, and confusion Heme: Denies bruising, bleeding, and  enlarged lymph nodes.  Physical Exam: There were no vitals taken for this visit. General:   Alert and oriented. Pleasant and cooperative. Well-nourished and well-developed.  Head:  Normocephalic and atraumatic. Eyes:  Without icterus, sclera clear and conjunctiva pink.  Ears:  Normal auditory acuity. Lungs:  Clear to auscultation bilaterally. No wheezes, rales, or rhonchi. No distress.  Heart:  S1, S2 present without murmurs appreciated.  Abdomen:  +BS, soft, non-tender and non-distended. No HSM noted. No guarding or rebound. No masses appreciated.  Rectal:  Deferred  Msk:  Symmetrical without gross deformities. Normal posture. Extremities:  Without edema. Neurologic:  Alert and  oriented x4;  grossly normal neurologically. Skin:  Intact without significant lesions or rashes. Psych:  Alert and cooperative. Normal mood and affect.    Assessment:     Plan:  ***   Ermalinda Memos, PA-C [facility] 03/05/2023

## 2023-03-05 ENCOUNTER — Telehealth: Payer: Self-pay | Admitting: *Deleted

## 2023-03-05 ENCOUNTER — Ambulatory Visit (INDEPENDENT_AMBULATORY_CARE_PROVIDER_SITE_OTHER): Payer: 59 | Admitting: Gastroenterology

## 2023-03-05 ENCOUNTER — Encounter: Payer: Self-pay | Admitting: Gastroenterology

## 2023-03-05 ENCOUNTER — Encounter: Payer: Self-pay | Admitting: *Deleted

## 2023-03-05 ENCOUNTER — Other Ambulatory Visit: Payer: Self-pay | Admitting: *Deleted

## 2023-03-05 VITALS — BP 136/92 | HR 71 | Temp 97.9°F | Ht 75.0 in | Wt 217.6 lb

## 2023-03-05 DIAGNOSIS — K219 Gastro-esophageal reflux disease without esophagitis: Secondary | ICD-10-CM

## 2023-03-05 DIAGNOSIS — K625 Hemorrhage of anus and rectum: Secondary | ICD-10-CM | POA: Diagnosis not present

## 2023-03-05 DIAGNOSIS — K6289 Other specified diseases of anus and rectum: Secondary | ICD-10-CM

## 2023-03-05 MED ORDER — PEG 3350-KCL-NA BICARB-NACL 420 G PO SOLR: 4000.0000 mL | Freq: Once | ORAL | 0 refills | Status: AC

## 2023-03-05 NOTE — Telephone Encounter (Signed)
[employer] PA: CPT Code GECOL Description: Colonoscopy Case Number: [id number] Review Date: 03/05/2023 9:40:04 AM Expiration Date: N/A Status: This member is not in scope for prior-authorization/notification for the services requested. You can save the case reference ID as validation of your request.

## 2023-03-05 NOTE — Patient Instructions (Addendum)
We will arrange for you to have a colonoscopy with Dr. Jena Gauss at [facility].  You may use Preparation H twice a day for 7 days if your rectal bleeding returns to treat possible hemorrhoids.  We will follow-up with you in the office after your procedure.  Do not hesitate to call if you have questions or concerns prior.  It was nice to meet you today!  Ermalinda Memos, PA-C [facility]

## 2023-03-17 ENCOUNTER — Other Ambulatory Visit: Payer: Self-pay

## 2023-03-17 ENCOUNTER — Encounter (HOSPITAL_COMMUNITY): Payer: Self-pay | Admitting: Internal Medicine

## 2023-03-17 ENCOUNTER — Ambulatory Visit (HOSPITAL_COMMUNITY): Payer: 59 | Admitting: Anesthesiology

## 2023-03-17 ENCOUNTER — Ambulatory Visit (HOSPITAL_COMMUNITY)
Admission: RE | Admit: 2023-03-17 | Discharge: 2023-03-17 | Disposition: A | Discharge status: Home / Self Care | Payer: 59 | Attending: Internal Medicine | Admitting: Internal Medicine

## 2023-03-17 ENCOUNTER — Encounter (HOSPITAL_COMMUNITY)
Admission: RE | Disposition: A | Discharge status: Home / Self Care | Payer: Self-pay | Source: Home / Self Care | Attending: Internal Medicine

## 2023-03-17 DIAGNOSIS — K625 Hemorrhage of anus and rectum: Secondary | ICD-10-CM

## 2023-03-17 DIAGNOSIS — Z8379 Family history of other diseases of the digestive system: Secondary | ICD-10-CM | POA: Insufficient documentation

## 2023-03-17 DIAGNOSIS — F17210 Nicotine dependence, cigarettes, uncomplicated: Secondary | ICD-10-CM | POA: Insufficient documentation

## 2023-03-17 DIAGNOSIS — Z21 Asymptomatic human immunodeficiency virus [HIV] infection status: Secondary | ICD-10-CM | POA: Insufficient documentation

## 2023-03-17 DIAGNOSIS — I10 Essential (primary) hypertension: Secondary | ICD-10-CM | POA: Insufficient documentation

## 2023-03-17 DIAGNOSIS — K921 Melena: Secondary | ICD-10-CM | POA: Diagnosis not present

## 2023-03-17 DIAGNOSIS — F419 Anxiety disorder, unspecified: Secondary | ICD-10-CM | POA: Insufficient documentation

## 2023-03-17 DIAGNOSIS — R110 Nausea: Secondary | ICD-10-CM | POA: Diagnosis not present

## 2023-03-17 HISTORY — PX: COLONOSCOPY WITH PROPOFOL: SHX5780

## 2023-03-17 SURGERY — COLONOSCOPY WITH PROPOFOL
Anesthesia: General

## 2023-03-17 MED ORDER — PROPOFOL 10 MG/ML IV BOLUS
INTRAVENOUS | Status: DC | PRN
  Administered 2023-03-17: 30 mg via INTRAVENOUS
  Administered 2023-03-17: 120 mg via INTRAVENOUS

## 2023-03-17 MED ORDER — LIDOCAINE HCL 1 % IJ SOLN
INTRAMUSCULAR | Status: DC | PRN
  Administered 2023-03-17: 50 mg via INTRADERMAL

## 2023-03-17 MED ORDER — LACTATED RINGERS IV SOLN: INTRAVENOUS | Status: DC

## 2023-03-17 NOTE — Op Note (Signed)
[facility] Patient Name: Kyle Gill Procedure Date: 03/17/2023 11:12 AM MRN: [id number] Date of Birth: May 06, 1981 Attending MD: Gennette Pac , MD, [id number] CSN: [id number] Age: 42 Admit Type: Outpatient Procedure:                Colonoscopy(attempted) Indications:              Hematochezia Providers:                Gennette Pac, MD, Buel Ream. Thomasena Edis RN, RN,                            Zena Amos Referring MD:              Medicines:                Propofol per Anesthesia Complications:            No immediate complications. Estimated Blood Loss:     Estimated blood loss: none. Procedure:                Pre-Anesthesia Assessment:                           - Prior to the procedure, a History and Physical                            was performed, and patient medications and                            allergies were reviewed. The patient's tolerance of                            previous anesthesia was also reviewed. The risks                            and benefits of the procedure and the sedation                            options and risks were discussed with the patient.                            All questions were answered, and informed consent                            was obtained. Prior Anticoagulants: The patient has                            taken no anticoagulant or antiplatelet agents. ASA                            Grade Assessment: II - A patient with mild systemic                            disease. After reviewing the risks and benefits,  the patient was deemed in satisfactory condition to                            undergo the procedure.                           After obtaining informed consent, the colonoscope                            was passed under direct vision. Throughout the                            procedure, the patient's blood pressure, pulse, and                            oxygen saturations were  monitored continuously. The                            [id number]) scope was introduced through the                            anus with the intention of advancing to the cecum.                            The scope was advanced to the transverse colon                            before the procedure was aborted. Medications were                            given. The colonoscopy was performed without                            difficulty. The patient tolerated the procedure                            well. The quality of the bowel preparation was                            inadequate. Scope In: 12:02:02 PM Scope Out: 12:06:26 PM Total Procedure Duration: 0 hours 4 minutes 24 seconds  Findings:      The perianal and digital rectal examinations were normal. Endoscopic       findings. There was a fair amount of liquid and semiformed stool in the       rectum. Seem to be less present in the left colon however 1 got into the       distal transverse segment it was apparent the patient was not adequately       prep with formed and semiformed stool lining most of the colonic wall.       At this point the procedure was terminated. Impression:               - Preparation of the colon was inadequate.                           -  No specimens collected. Moderate Sedation:      Moderate (conscious) sedation was personally administered by an       anesthesia professional. The following parameters were monitored: oxygen       saturation, heart rate, blood pressure, respiratory rate, EKG, adequacy       of pulmonary ventilation, and response to care. Recommendation:           - Patient has a contact number available for                            emergencies. The signs and symptoms of potential                            delayed complications were discussed with the                            patient. Return to normal activities tomorrow.                            Written discharge instructions  were provided to the                            patient.                           - Advance diet as tolerated.                           - Continue present medications.                           - Return to my office in 4 weeks. Procedure Code(s):        --- Professional ---                           [phone], 53, Colonoscopy, flexible; diagnostic,                            including collection of specimen(s) by brushing or                            washing, when performed (separate procedure) Diagnosis Code(s):        --- Professional ---                           K92.1, Melena (includes Hematochezia) CPT copyright 2022 American [employer] Association. All rights reserved. The codes documented in this report are preliminary and upon coder review may  be revised to meet current compliance requirements. Gerrit Friends. Farhan Jean, MD Gennette Pac, MD 03/17/2023 12:14:18 PM This report has been signed electronically. Number of Addenda: 0

## 2023-03-17 NOTE — Transfer of Care (Signed)
Immediate Anesthesia Transfer of Care Note  Patient: Kyle Gill  Procedure(s) Performed: COLONOSCOPY WITH PROPOFOL  Patient Location: Endoscopy Unit  Anesthesia Type:General  Level of Consciousness: awake  Airway & Oxygen Therapy: Patient Spontanous Breathing  Post-op Assessment: Report given to RN  Post vital signs: Reviewed and stable  Last Vitals:  Vitals Value Taken Time  BP 107/67 03/17/23 1212  Temp 36.6 C 03/17/23 1212  Pulse 74 03/17/23 1212  Resp 20 03/17/23 1212  SpO2 97 % 03/17/23 1212    Last Pain:  Vitals:   03/17/23 1212  TempSrc: Oral  PainSc: 0-No pain      Patients Stated Pain Goal: 4 (03/17/23 1113)  Complications: No notable events documented.

## 2023-03-17 NOTE — Interval H&P Note (Signed)
History and Physical Interval Note:  03/17/2023 11:47 AM  Kyle Gill  has presented today for surgery, with the diagnosis of rectal bleeding.  The various methods of treatment have been discussed with the patient and family. After consideration of risks, benefits and other options for treatment, the patient has consented to  Procedure(s) with comments: COLONOSCOPY WITH PROPOFOL (N/A) - 1:00 pm, asa 2 as a surgical intervention.  The patient's history has been reviewed, patient examined, no change in status, stable for surgery.  I have reviewed the patient's chart and labs.  Questions were answered to the patient's satisfaction.       no change.  Diagnostic colonoscopy today for change in bowel habits and rectal bleeding The risks, benefits, limitations, alternatives and imponderables have been reviewed with the patient. Questions have been answered. All parties are agreeable.   Eula Listen

## 2023-03-17 NOTE — Anesthesia Postprocedure Evaluation (Signed)
Anesthesia Post Note  Patient: Kyle Gill  Procedure(s) Performed: COLONOSCOPY WITH PROPOFOL  Patient location during evaluation: Endoscopy Anesthesia Type: General Level of consciousness: awake and alert Pain management: pain level controlled Vital Signs Assessment: post-procedure vital signs reviewed and stable Respiratory status: spontaneous breathing Cardiovascular status: stable Postop Assessment: no apparent nausea or vomiting Anesthetic complications: no   No notable events documented.   Last Vitals:  Vitals:   03/17/23 1113 03/17/23 1212  BP: (!) 145/100 107/67  Pulse: 74 74  Resp: (!) 21 20  Temp: 36.6 C 36.6 C  SpO2: 97% 97%    Last Pain:  Vitals:   03/17/23 1212  TempSrc: Oral  PainSc: 0-No pain                 Sherilyn Windhorst

## 2023-03-17 NOTE — Anesthesia Preprocedure Evaluation (Addendum)
Anesthesia Evaluation  Patient identified by MRN, date of birth, ID band Patient awake    Reviewed: Allergy & Precautions, H&P , NPO status , Patient's Chart, lab work & pertinent test results  Airway Mallampati: II  TM Distance: >3 FB Neck ROM: Full    Dental  (+) Missing, Chipped, Dental Advisory Given,    Pulmonary pneumonia, Current Smoker and Patient abstained from smoking.   Pulmonary exam normal breath sounds clear to auscultation       Cardiovascular hypertension, Pt. on medications Normal cardiovascular exam Rhythm:Regular Rate:Normal     Neuro/Psych  PSYCHIATRIC DISORDERS Anxiety Depression     Neuromuscular disease    GI/Hepatic negative GI ROS, Neg liver ROS,,,  Endo/Other  negative endocrine ROS    Renal/GU negative Renal ROS  negative genitourinary   Musculoskeletal negative musculoskeletal ROS (+)    Abdominal   Peds negative pediatric ROS (+)  Hematology negative hematology ROS (+)   Anesthesia Other Findings   Reproductive/Obstetrics negative OB ROS                             Anesthesia Physical Anesthesia Plan  ASA: 2  Anesthesia Plan: General   Post-op Pain Management: Minimal or no pain anticipated   Induction: Intravenous  PONV Risk Score and Plan: 1 and Treatment may vary due to age or medical condition  Airway Management Planned: Natural Airway and Nasal Cannula  Additional Equipment:   Intra-op Plan:   Post-operative Plan:   Informed Consent: I have reviewed the patients History and Physical, chart, labs and discussed the procedure including the risks, benefits and alternatives for the proposed anesthesia with the patient or authorized representative who has indicated his/her understanding and acceptance.     Dental advisory given  Plan Discussed with: CRNA and Surgeon  Anesthesia Plan Comments:        Anesthesia Quick Evaluation

## 2023-03-17 NOTE — Discharge Instructions (Signed)
  Colonoscopy Discharge Instructions  Read the instructions outlined below and refer to this sheet in the next few weeks. These discharge instructions provide you with general information on caring for yourself after you leave the hospital. Your doctor may also give you specific instructions. While your treatment has been planned according to the most current medical practices available, unavoidable complications occasionally occur. If you have any problems or questions after discharge, call Dr. Jena Gauss at [phone]. ACTIVITY You may resume your regular activity, but move at a slower pace for the next 24 hours.  Take frequent rest periods for the next 24 hours.  Walking will help get rid of the air and reduce the bloated feeling in your belly (abdomen).  No driving for 24 hours (because of the medicine (anesthesia) used during the test).   Do not sign any important legal documents or operate any machinery for 24 hours (because of the anesthesia used during the test).  NUTRITION Drink plenty of fluids.  You may resume your normal diet as instructed by your doctor.  Begin with a light meal and progress to your normal diet. Heavy or fried foods are harder to digest and may make you feel sick to your stomach (nauseated).  Avoid alcoholic beverages for 24 hours or as instructed.  MEDICATIONS You may resume your normal medications unless your doctor tells you otherwise.  WHAT YOU CAN EXPECT TODAY Some feelings of bloating in the abdomen.  Passage of more gas than usual.  Spotting of blood in your stool or on the toilet paper.  IF YOU HAD POLYPS REMOVED DURING THE COLONOSCOPY: No aspirin products for 7 days or as instructed.  No alcohol for 7 days or as instructed.  Eat a soft diet for the next 24 hours.  FINDING OUT THE RESULTS OF YOUR TEST Not all test results are available during your visit. If your test results are not back during the visit, make an appointment with your caregiver to find out the  results. Do not assume everything is normal if you have not heard from your caregiver or the medical facility. It is important for you to follow up on all of your test results.  SEEK IMMEDIATE MEDICAL ATTENTION IF: You have more than a spotting of blood in your stool.  Your belly is swollen (abdominal distention).  You are nauseated or vomiting.  You have a temperature over 101.  You have abdominal pain or discomfort that is severe or gets worse throughout the day.       Your colonoscopy preparation was inadequate.  Therefore, your colonoscopy could not be completed     office visit with Ermalinda Memos in 6 to 8 weeks   at patient request I called Lilyan Punt at [phone] findings and recommendations.

## 2023-03-20 ENCOUNTER — Encounter (HOSPITAL_COMMUNITY): Payer: Self-pay | Admitting: Internal Medicine

## 2023-04-15 ENCOUNTER — Encounter: Payer: Self-pay | Admitting: Internal Medicine

## 2023-04-15 ENCOUNTER — Ambulatory Visit: Payer: 59 | Admitting: Internal Medicine

## 2023-05-21 ENCOUNTER — Encounter: Payer: Self-pay | Admitting: Pharmacist

## 2023-05-28 ENCOUNTER — Ambulatory Visit: Payer: 59 | Admitting: Family Medicine

## 2023-05-29 ENCOUNTER — Encounter: Payer: Self-pay | Admitting: Family Medicine

## 2023-10-22 ENCOUNTER — Other Ambulatory Visit: Payer: Self-pay

## 2023-10-22 ENCOUNTER — Encounter: Payer: Self-pay | Admitting: Internal Medicine

## 2023-10-22 ENCOUNTER — Telehealth: Payer: Self-pay

## 2023-10-22 ENCOUNTER — Telehealth (INDEPENDENT_AMBULATORY_CARE_PROVIDER_SITE_OTHER): Payer: 59 | Admitting: Internal Medicine

## 2023-10-22 VITALS — Ht 75.0 in | Wt 216.0 lb

## 2023-10-22 DIAGNOSIS — J111 Influenza due to unidentified influenza virus with other respiratory manifestations: Secondary | ICD-10-CM

## 2023-10-22 MED ORDER — OSELTAMIVIR PHOSPHATE 75 MG PO CAPS: 75.0000 mg | ORAL_CAPSULE | Freq: Two times a day (BID) | ORAL | 0 refills | Status: DC

## 2023-10-22 NOTE — Progress Notes (Signed)
 Virtual Visit via Video Note   Because of Cornellius Herrera's co-morbid illnesses, he is at least at moderate risk for complications without adequate follow up.  This format is felt to be most appropriate for this patient at this time.  All issues noted in this document were discussed and addressed.  A limited physical exam was performed with this format.      Evaluation Performed:  Follow-up visit  Date:  10/22/2023   ID:  Kyle Gill, DOB 10/23/1980, MRN [id number]  Patient Location: Home Provider Location: Office/Clinic  Participants: Patient Location of Patient: Home Location of Provider: Telehealth Consent was obtain for visit to be over via telehealth. I verified that I am speaking with the correct person using two identifiers.  PCP:  Gilmore Laroche, FNP   Chief Complaint: Cough  History of Present Illness:    Kyle Gill is a 43 y.o. male who has a video visit for c/o cough, nasal congestion, fever, chills and myalgias for the last 3 days.  He also reports nausea and diarrhea.  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  He had negative home COVID test.  Denies any dyspnea or wheezing currently.  The patient does not have symptoms concerning for COVID-19 infection (fever, chills, cough, or new shortness of breath).   Past Medical, Surgical, Social History, Allergies, and Medications have been Reviewed.  Past Medical History:  Diagnosis Date   Bell's palsy    HIV positive (HCC)    Hypertension    Pneumonia    Past Surgical History:  Procedure Laterality Date   COLONOSCOPY WITH PROPOFOL N/A 03/17/2023   Procedure: COLONOSCOPY WITH PROPOFOL;  Surgeon: Corbin Ade, MD;  [facility];  Service: Endoscopy;  Laterality: N/A;  1:00 pm, asa 2   SHOULDER ARTHROSCOPY WITH LABRAL REPAIR Right 06/13/2016   Procedure: SHOULDER DIAGNOSTIC OPERATIVE ARTHROSCOPY WITH LABRAL REPAIR;  Surgeon: Cammy Copa,  MD;  Location: [facility];  Service: Orthopedics;  Laterality: Right;     No outpatient medications have been marked as taking for the 10/22/23 encounter (Appointment) with Anabel Halon, MD.     Allergies:   Aspirin, Penicillins, Sulfamethoxazole-trimethoprim, and Chlorhexidine   ROS:   Please see the history of present illness. All other systems reviewed and are negative.   Labs/Other Tests and Data Reviewed:    Recent Labs: 02/25/2023: Hemoglobin 18.4; Platelets 243   Recent Lipid Panel Lab Results  Component Value Date/Time   CHOL 172 12/05/2021 08:24 AM   TRIG 214 (H) 12/05/2021 08:24 AM   HDL 40 12/05/2021 08:24 AM   CHOLHDL 4.3 12/05/2021 08:24 AM   LDLCALC 95 12/05/2021 08:24 AM    Wt Readings from Last 3 Encounters:  03/05/23 217 lb 9.6 oz (98.7 kg)  02/25/23 215 lb 1.9 oz (97.6 kg)  12/31/22 224 lb (101.6 kg)     Objective:    Vital Signs:  There were no vitals taken for this visit.   VITAL SIGNS:  reviewed GEN:  no acute distress, appears fatigued EYES:  sclerae anicteric, EOMI - Extraocular Movements Intact RESPIRATORY:  normal respiratory effort, symmetric expansion NEURO:  alert and oriented x 3, no obvious focal deficit PSYCH:  normal affect  ASSESSMENT & PLAN:    Flu syndrome Considering recent exposure and his history of HIV, start Tamiflu Unable to get flu test due to early closure of office today due to weather conditions, would avoid waiting  for flu test in his case Advised to maintain adequate hydration Has Zofran as needed for nausea  I discussed the assessment and treatment plan with the patient. The patient was provided an opportunity to ask questions, and all were answered. The patient agreed with the plan and demonstrated an understanding of the instructions.   The patient was advised to call back or seek an in-person evaluation if the symptoms worsen or if the condition fails to improve as anticipated.  The above assessment and management  plan was discussed with the patient. The patient verbalized understanding of and has agreed to the management plan.   Medication Adjustments/Labs and Tests Ordered: Current medicines are reviewed at length with the patient today.  Concerns regarding medicines are outlined above.   Tests Ordered: No orders of the defined types were placed in this encounter.   Medication Changes: No orders of the defined types were placed in this encounter.    Note: This dictation was prepared with Dragon dictation along with smaller phrase technology. Similar sounding words can be transcribed inadequately or may not be corrected upon review. Any transcriptional errors that result from this process are unintentional.      Disposition:  Follow up  Signed, Anabel Halon, MD  10/22/2023 11:59 AM     [facility]

## 2023-10-22 NOTE — Telephone Encounter (Signed)
 Copied from CRM [id number]. Topic: General - Other >> Oct 22, 2023  1:34 PM Fredrica W wrote: Reason for CRM: Patient called back states see had virtual visit with Dr Allena Katz and was advised he would have a note in MyChart for him after 1PM and its not there. Thank You

## 2023-10-22 NOTE — Telephone Encounter (Signed)
 Work note sent via [employer]. ?

## 2023-11-12 ENCOUNTER — Other Ambulatory Visit: Payer: Self-pay | Admitting: Family Medicine

## 2023-11-12 DIAGNOSIS — I10 Essential (primary) hypertension: Secondary | ICD-10-CM

## 2024-01-22 ENCOUNTER — Telehealth: Payer: Self-pay | Admitting: Family Medicine

## 2024-01-22 ENCOUNTER — Encounter: Payer: Self-pay | Admitting: Internal Medicine

## 2024-01-22 ENCOUNTER — Ambulatory Visit (INDEPENDENT_AMBULATORY_CARE_PROVIDER_SITE_OTHER): Admitting: Internal Medicine

## 2024-01-22 VITALS — BP 150/88 | HR 93 | Ht 75.0 in | Wt 211.8 lb

## 2024-01-22 DIAGNOSIS — R32 Unspecified urinary incontinence: Secondary | ICD-10-CM | POA: Diagnosis not present

## 2024-01-22 DIAGNOSIS — N5089 Other specified disorders of the male genital organs: Secondary | ICD-10-CM | POA: Diagnosis not present

## 2024-01-22 NOTE — Telephone Encounter (Signed)
 Prescription Request  01/22/2024  LOV: 02/25/2023  What is the name of the medication or equipment? clonazePAM  (KLONOPIN ) 0.5 MG tablet [[id number]]    Have you contacted your pharmacy to request a refill? Yes   Which pharmacy would you like this sent to?  [id number] - [place], [place] - [address] AT [facility] [address] [place] [address] Phone: [phone] Fax: [phone]    Patient notified that their request is being sent to the clinical staff for review and that they should receive a response within 2 business days.   Please advise at [facility] [phone]

## 2024-01-22 NOTE — Progress Notes (Signed)
   Acute Office Visit  Subjective:     Patient ID: Kyle Gill, male    DOB: 1980-12-13, 43 y.o.   MRN: [id number]  Chief Complaint  Patient presents with   Mass    Lump on left testicle, uncomfortable and painful , came up three nights ago    Mr. Brenning presents today for an acute visit for evaluation of a painful left sided scrotal mass.  He first noticed the mass 3 days ago.  Pain radiates to his lower abdomen and he endorses difficulty holding his urine when he feels the urge to void.  He has not appreciated any blood in his urine and denies dysuria.  Denies recent trauma but has recently engaged in what he describes as "rough" intercourse.  Review of Systems  Genitourinary:        Painful left-sided scrotal mass      Objective:    BP (!) 150/88   Pulse 93   Ht 6\' 3"  (1.905 m)   Wt 211 lb 12.8 oz (96.1 kg)   SpO2 96%   BMI 26.47 kg/m   Physical Exam Genitourinary:    Penis: Normal.      Testes: Normal.     Comments: There is a mildly tender, soft, compressible mass just above the left testicle.      Assessment & Plan:   Problem List Items Addressed This Visit       Scrotal mass - Primary   Presenting today for evaluation of a left-sided scrotal mass present x 3 days.  He describes urgent continence in recent days.  He has engaged in rough intercourse recently.  On exam there is a mildly tender, soft, compressible mass just above the left testicle. -Treatment options reviewed.  Will obtain scrotal ultrasound for better characterization of the palpable abnormality.  Will also check UA and complete STI screening based on development of urge incontinence and recently reported sexual activity.  Further management pending initial results.  He is due for routine follow-up with his PCP.  Will request follow-up in 2 to 4 weeks.      Return in about 2 weeks (around 02/05/2024).  Tobi Fortes, MD

## 2024-01-22 NOTE — Patient Instructions (Signed)
 It was a pleasure to see you today.  Thank you for giving us  the opportunity to be involved in your care.  Below is a brief recap of your visit and next steps.  We will plan to see you again in 2-4 weeks.  Summary Scrotal ultrasound and urine study ordered today Follow up in 2-4 weeks

## 2024-01-23 ENCOUNTER — Ambulatory Visit: Payer: Self-pay | Admitting: Internal Medicine

## 2024-01-25 LAB — GC/CHLAMYDIA PROBE AMP
Chlamydia trachomatis, NAA: NEGATIVE
Neisseria Gonorrhoeae by PCR: NEGATIVE

## 2024-01-25 LAB — UA/M W/RFLX CULTURE, ROUTINE
Bilirubin, UA: NEGATIVE
Glucose, UA: NEGATIVE
Nitrite, UA: NEGATIVE
Specific Gravity, UA: 1.022 (ref 1.005–1.030)
Urobilinogen, Ur: 1 mg/dL (ref 0.2–1.0)
pH, UA: 5 (ref 5.0–7.5)

## 2024-01-25 LAB — MICROSCOPIC EXAMINATION
Bacteria, UA: NONE SEEN
Casts: NONE SEEN /LPF
Epithelial Cells (non renal): NONE SEEN /HPF (ref 0–10)
WBC, UA: NONE SEEN /HPF (ref 0–5)

## 2024-01-25 LAB — URINE CULTURE, REFLEX: Organism ID, Bacteria: NO GROWTH

## 2024-01-27 NOTE — Telephone Encounter (Signed)
 Called patient voicemail not setup. Sent a mychart message to contact office to schedule an appointment or can schedule through mychart.

## 2024-01-30 ENCOUNTER — Ambulatory Visit (HOSPITAL_COMMUNITY)
Admission: RE | Admit: 2024-01-30 | Discharge: 2024-01-30 | Disposition: A | Discharge status: Home / Self Care | Source: Ambulatory Visit | Attending: Internal Medicine | Admitting: Internal Medicine

## 2024-01-30 DIAGNOSIS — N5089 Other specified disorders of the male genital organs: Secondary | ICD-10-CM | POA: Diagnosis present

## 2024-02-02 ENCOUNTER — Other Ambulatory Visit: Payer: Self-pay | Admitting: Internal Medicine

## 2024-02-02 ENCOUNTER — Encounter: Payer: Self-pay | Admitting: Internal Medicine

## 2024-02-02 DIAGNOSIS — I861 Scrotal varices: Secondary | ICD-10-CM

## 2024-02-02 DIAGNOSIS — N5089 Other specified disorders of the male genital organs: Secondary | ICD-10-CM | POA: Insufficient documentation

## 2024-02-02 NOTE — Assessment & Plan Note (Signed)
 Presenting today for evaluation of a left-sided scrotal mass present x 3 days.  He describes urgent continence in recent days.  He has engaged in rough intercourse recently.  On exam there is a mildly tender, soft, compressible mass just above the left testicle. -Treatment options reviewed.  Will obtain scrotal ultrasound for better characterization of the palpable abnormality.  Will also check UA and complete STI screening based on development of urge incontinence and recently reported sexual activity.  Further management pending initial results.  He is due for routine follow-up with his PCP.  Will request follow-up in 2 to 4 weeks.

## 2024-03-03 ENCOUNTER — Telehealth: Payer: Self-pay | Admitting: Family Medicine

## 2024-03-03 NOTE — Telephone Encounter (Signed)
 Prescription Request  03/03/2024  LOV: Visit date not found  What is the name of the medication or equipment? clonazePAM  (KLONOPIN ) 0.5 MG tablet [[id number]]   Have you contacted your pharmacy to request a refill? Yes   Which pharmacy would you like this sent to?  [id number] - [place], [place] - [address] AT [facility] [address] [place] [address] Phone: [phone] Fax: [phone]    Patient notified that their request is being sent to the clinical staff for review and that they should receive a response within 2 business days.   Please advise at Mobile [phone] (mobile) clonazePAM  (KLONOPIN ) 0.5 MG tablet [[id number]]

## 2024-03-03 NOTE — Telephone Encounter (Signed)
 Is it okay for patient to wait to see Gloria Zarwolo in September?  He was rescheduled from 07/17 provider out of office had to rescheduled to the next opening 09/22 or provider willing to overbook and work in patient sooner.

## 2024-03-04 ENCOUNTER — Other Ambulatory Visit: Payer: Self-pay | Admitting: Family Medicine

## 2024-03-10 NOTE — Telephone Encounter (Signed)
 Spoke with patient appt will be scheduled 7/25 . Patient aware

## 2024-03-17 ENCOUNTER — Ambulatory Visit: Admitting: Urology

## 2024-03-17 ENCOUNTER — Encounter: Payer: Self-pay | Admitting: Urology

## 2024-03-17 VITALS — BP 136/90 | HR 81

## 2024-03-17 DIAGNOSIS — N521 Erectile dysfunction due to diseases classified elsewhere: Secondary | ICD-10-CM | POA: Diagnosis not present

## 2024-03-17 DIAGNOSIS — I861 Scrotal varices: Secondary | ICD-10-CM

## 2024-03-17 DIAGNOSIS — N5089 Other specified disorders of the male genital organs: Secondary | ICD-10-CM

## 2024-03-17 LAB — URINALYSIS, ROUTINE W REFLEX MICROSCOPIC
Bilirubin, UA: NEGATIVE
Glucose, UA: NEGATIVE
Ketones, UA: NEGATIVE
Leukocytes,UA: NEGATIVE
Nitrite, UA: NEGATIVE
Specific Gravity, UA: 1.03 (ref 1.005–1.030)
Urobilinogen, Ur: 0.2 mg/dL (ref 0.2–1.0)
pH, UA: 5.5 (ref 5.0–7.5)

## 2024-03-17 LAB — MICROSCOPIC EXAMINATION
Bacteria, UA: NONE SEEN
WBC, UA: NONE SEEN /HPF (ref 0–5)

## 2024-03-17 MED ORDER — TADALAFIL 5 MG PO TABS: 5.0000 mg | ORAL_TABLET | Freq: Every day | ORAL | 0 refills | Status: AC | PRN

## 2024-03-17 NOTE — Progress Notes (Signed)
 03/17/2024 9:55 AM   Kyle Gill 07-24-1981 [id number]  Referring provider: Melvenia Manus BRAVO, MD [address] [place],  [place] [address]  Left scrotal swelling   HPI: Kyle Gill is a 43yo here for evaluation of left scrotal swelling. Starting 3 months ago he developed left testicular pain and difficulty getting an erection. He underwent scrotal US  01/30/2024 which showed a left varicocele.  He continues to have dull left testicular pain that flares.    PMH: Past Medical History:  Diagnosis Date   Bell's palsy    HIV positive (HCC)    Hypertension    Pneumonia     Surgical History: Past Surgical History:  Procedure Laterality Date   COLONOSCOPY WITH PROPOFOL  N/A 03/17/2023   Procedure: COLONOSCOPY WITH PROPOFOL ;  Surgeon: Shaaron Lamar HERO, MD;  [facility];  Service: Endoscopy;  Laterality: N/A;  1:00 pm, asa 2   SHOULDER ARTHROSCOPY WITH LABRAL REPAIR Right 06/13/2016   Procedure: SHOULDER DIAGNOSTIC OPERATIVE ARTHROSCOPY WITH LABRAL REPAIR;  Surgeon: Glendia Cordella Hutchinson, MD;  Location: [facility] OR;  Service: Orthopedics;  Laterality: Right;    Home Medications:  Allergies as of 03/17/2024       Reactions   Aspirin Itching, Other (See Comments)   Dry mouth   Penicillins Itching, Other (See Comments)   Inflamed skin around genitalia   Sulfamethoxazole-trimethoprim Itching, Other (See Comments)   Inflamed skin around genitalia   Chlorhexidine  Hives        Medication List        Accurate as of March 17, 2024  9:55 AM. If you have any questions, ask your nurse or doctor.          clonazePAM  0.5 MG tablet Commonly known as: KLONOPIN  Take 1 tablet (0.5 mg total) by mouth 2 (two) times daily as needed for anxiety.   ibuprofen  200 MG tablet Commonly known as: ADVIL  Take 200 mg by mouth every 8 (eight) hours as needed (pain.).   olmesartan  20 MG tablet Commonly known as: BENICAR  TAKE 1 TABLET BY MOUTH EVERY DAY   ondansetron  4 MG tablet Commonly  known as: Zofran  Take 1 tablet (4 mg total) by mouth every 8 (eight) hours as needed for nausea or vomiting.   oseltamivir  75 MG capsule Commonly known as: Tamiflu  Take 1 capsule (75 mg total) by mouth 2 (two) times daily.        Allergies:  Allergies  Allergen Reactions   Aspirin Itching and Other (See Comments)    Dry mouth   Penicillins Itching and Other (See Comments)    Inflamed skin around genitalia   Sulfamethoxazole-Trimethoprim Itching and Other (See Comments)    Inflamed skin around genitalia   Chlorhexidine  Hives    Family History: Family History  Problem Relation Age of Onset   Colon polyps Father        Numerous   Cancer Sister        Not sure of cancer type but thinks it involved her intestine   Inflammatory bowel disease Neg Hx     Social History:  reports that he has been smoking cigarettes. He has never used smokeless tobacco. He reports current alcohol use. He reports current drug use. Drug: Marijuana.  ROS: All other review of systems were reviewed and are negative except what is noted above in HPI  Physical Exam: BP (!) 136/90   Pulse 81   Constitutional:  Alert and oriented, No acute distress. HEENT: [place] AT, moist mucus membranes.  Trachea midline, no masses. Cardiovascular: No clubbing, cyanosis, or edema. Respiratory: Normal respiratory effort, no increased work of breathing. GI: Abdomen is soft, nontender, nondistended, no abdominal masses GU: No CVA tenderness. Circumcised phallus. No masses/lesions on penis, testis, scrotum. Large left varicocele  Lymph: No cervical or inguinal lymphadenopathy. Skin: No rashes, bruises or suspicious lesions. Neurologic: Grossly intact, no focal deficits, moving all 4 extremities. Psychiatric: Normal mood and affect.  Laboratory Data: Lab Results  Component Value Date   WBC 4.4 02/25/2023   HGB 18.4 (H) 02/25/2023   HCT 53.7 (H) 02/25/2023   MCV 91 02/25/2023   PLT 243 02/25/2023    Lab Results   Component Value Date   CREATININE 0.99 11/05/2021    No results found for: PSA  No results found for: TESTOSTERONE  Lab Results  Component Value Date   HGBA1C 5.9 (H) 12/05/2021    Urinalysis    Component Value Date/Time   COLORURINE AMBER BIOCHEMICALS MAY BE AFFECTED BY COLOR (A) 09/10/2010 1620   APPEARANCEUR Clear 01/22/2024 0940   LABSPEC >1.030 (H) 09/10/2010 1620   PHURINE 5.0 09/10/2010 1620   GLUCOSEU Negative 01/22/2024 0940   HGBUR LARGE (A) 09/10/2010 1620   BILIRUBINUR Negative 01/22/2024 0940   KETONESUR TRACE (A) 09/10/2010 1620   PROTEINUR 1+ (A) 01/22/2024 0940   PROTEINUR NEGATIVE 09/10/2010 1620   UROBILINOGEN 0.2 09/10/2010 1620   NITRITE Negative 01/22/2024 0940   NITRITE NEGATIVE 09/10/2010 1620   LEUKOCYTESUR Trace (A) 01/22/2024 0940    Lab Results  Component Value Date   LABMICR See below: 01/22/2024   WBCUA None seen 01/22/2024   LABEPIT None seen 01/22/2024   BACTERIA None seen 01/22/2024    Pertinent Imaging: Scrotal US  01/30/2024: Images reviewed and discussed with the patient No results found for this or any previous visit.  No results found for this or any previous visit.  No results found for this or any previous visit.  No results found for this or any previous visit.  No results found for this or any previous visit.  No results found for this or any previous visit.  No results found for this or any previous visit.  No results found for this or any previous visit.   Assessment & Plan:    1. varicocele The risks/benefits/alternaitves to left varicocelectomy was explained to the patient and he understands and wishes to proceed with surgery - Urinalysis, Routine w reflex microscopic   No follow-ups on file.  Belvie Clara, MD  [facility] 

## 2024-03-17 NOTE — Patient Instructions (Signed)
 Varicocele  A varicocele is a swelling of veins in the scrotum. The scrotum is the sac that contains the testicles. Varicoceles can occur on either side of the scrotum, but they are more common on the left side. They occur most often in teenage boys and young men. In most cases, varicoceles are not a serious problem. They are usually small and painless and do not require treatment. Tests may be done to confirm the diagnosis. Treatment may be needed if: A varicocele is large, causes a lot of pain, or causes pain when exercising. Varicoceles are found on both sides of the scrotum. A varicocele causes a decrease in the size of the testicle in a growing adolescent. A varicocele makes it hard to get someone pregnant (infertility). What are the causes? This condition is caused by valves in the veins not working properly. Valves in the veins help to return blood from the scrotum and testicles to the heart. If these valves do not work well, blood flows backward and backs up into the veins, which causes the veins to swell. This is similar to what happens when varicose veins form in the leg. What are the signs or symptoms? Most varicoceles do not cause any symptoms. If symptoms do occur, they may include: Swelling on one side of the scrotum. The swelling may be more obvious when you are standing up. A lumpy feeling in the scrotum. A heavy feeling on one side of the scrotum. A dull ache in the scrotum, especially after exercise or prolonged standing or sitting. Slower growth or reduced size of the testicle on the side of the varicocele. This happens in young males. Infertility. This can occur if the testicle does not grow normally or if the condition causes problems with the sperm, such as a low sperm count or sperm that are not able to reach the egg. How is this diagnosed? This condition is diagnosed based on: Your medical history. A physical exam. Your health care provider may check and feel the scrotum  area to check for swollen or enlarged veins. An ultrasound. This may be done to confirm the diagnosis and to help rule out other causes of the swelling. How is this treated? Treatment is usually not needed for this condition. If you have any pain, your provider may give you medicine to help relieve it. Your provider may need to do tests to make sure that your varicocele does not cause problems. If further treatment is needed, you may have one of these procedures: Varicocelectomy. This is surgery to tie off the swollen veins so that the flow of blood goes to other veins instead. Embolization. This procedure uses a soft tube (catheter) to place metal coils or other devices to block the veins. This cuts off the blood flow to the swollen veins. Follow these instructions at home: Take over-the-counter and prescription medicines only as told by your provider. Wear supportive underwear. Use an athletic supporter when doing sports activities. Contact a health care provider if: Your pain is increasing. You have redness in the affected area. Your testicle is enlarged, swollen, or painful. You have swelling that does not get better when you are lying down. One of your testicles is smaller than the other. You develop swelling in your legs. Get help right away if: You have difficulty breathing. This symptom may be an emergency. Get help right away. Call 911. Do not wait to see if the symptom will go away. Do not drive yourself to the hospital. This information  is not intended to replace advice given to you by your health care provider. Make sure you discuss any questions you have with your health care provider. Document Revised: 05/15/2022 Document Reviewed: 05/15/2022 Elsevier Patient Education  2024 [employer].

## 2024-03-18 ENCOUNTER — Ambulatory Visit: Admitting: Family Medicine

## 2024-04-26 NOTE — Patient Instructions (Signed)
 Kyle Gill  04/26/2024     @PREFPERIOPPHARMACY @   Your procedure is scheduled on  04/29/2024.   [facility] to [facility] at  0800 A.M.   Call this number if you have problems the morning of surgery:  [phone]  If you experience any cold or flu symptoms such as cough, fever, chills, shortness of breath, etc. between now and your scheduled surgery, please notify us  at the above number.   Remember:  Do not eat after midnight.   You may drink clear liquids until 0600 am on 04/29/2024.   Clear liquids allowed are:                    Water, Juice (No red color; non-citric and without pulp; diabetics please choose diet or no sugar options), Carbonated beverages (diabetics please choose diet or no sugar options), Clear Tea (No creamer, milk, or cream, including half & half and powdered creamer), Black Coffee Only (No creamer, milk or cream, including half & half and powdered creamer), and Clear Sports drink (No red color; diabetics please choose diet or no sugar options)    Take these medicines the morning of surgery with A SIP OF WATER                                                        None.    Do not wear jewelry, make-up or nail polish, including gel polish,  artificial nails, or any other type of covering on natural nails (fingers and  toes).  Do not wear lotions, powders, or perfumes, or deodorant.  Do not shave 48 hours prior to surgery.  Men may shave face and neck.  Do not bring valuables to the hospital.  [facility] is not responsible for any belongings or valuables.  Contacts, dentures or bridgework may not be worn into surgery.  Leave your suitcase in the car.  After surgery it may be brought to your room.  For patients admitted to the hospital, discharge time will be determined by your treatment team.  Patients discharged the day of surgery will not be allowed to drive home and must have someone with them for 24 hours.    Special instructions:  DO NOT smoke  tobacco or vape for 24 hours before your procedure.  Please read over the following fact sheets that you were given. Coughing and Deep Breathing, Surgical Site Infection Prevention, Anesthesia Post-op Instructions, and Care and Recovery After Surgery       Varicocelectomy, Care After After a varicocelectomy, it is common to have pain, swelling, and bruising. It is also common to have a small amount of clear fluid or blood coming from your incision. Follow these instructions at home: Medicines Take over-the-counter and prescription medicines only as told by your health care provider. Ask your provider if the medicine prescribed to you: Requires you to avoid driving or using machinery. Can cause constipation. You may need to take these actions to prevent or treat constipation: Drink enough fluid to keep your pee (urine) pale yellow. Take over-the-counter or prescription medicines. Eat foods that are high in fiber, such as beans, whole grains, and fresh fruits and vegetables. Bathing Do not take baths, swim, or use a hot tub until your provider approves. Ask your provider if you  may take showers. You may only be allowed to take sponge baths. If you were told to wear an athletic support strap, take it off when you shower or take a bath. Incision care  Follow instructions from your provider about how to take care of your incision areas. Make sure you: Wash your hands with soap and water for at least 20 seconds before and after you change your bandage (dressing). If soap and water are not available, use hand sanitizer. Change your dressing as told by your provider. Leave stitches (sutures) or tape strips in place. These skin closures may need to stay in place for 2 weeks or longer. If adhesive strip edges start to loosen and curl up, you may trim the loose edges. Do not remove adhesive strips completely unless your provider tells you to do that. Check your incision areas every day for signs of  infection. Check for: Redness. Warmth. More pain, swelling, or bruising. More fluid or blood. Pus or a bad smell. Managing pain, stiffness, and swelling  If told, put ice on the affected area. Put ice in a plastic bag. Place a towel between your skin and the bag. Leave the ice on for 20 minutes, 2-3 times a day. If your skin turns bright red, remove the ice right away to prevent skin damage. The risk of damage is higher if you cannot feel pain, heat, or cold. If told, raise (elevate) the scrotum on a towel while sitting or lying down. Activity Return to your normal activities as told by your provider. Ask your provider what activities are safe for you. Rest as told by your provider. Do not do any activities that require a lot of strength and energy. Do not sit for a long time without moving. Get up to take short walks every 1-2 hours. This will improve blood flow and breathing. Ask for help if you feel weak or unsteady. You may have to avoid lifting. Ask your provider how much you can safely lift. Ask your provider when it is safe to drive. Do not have sex until your provider approves. Avoid having an erection or ejaculation. General instructions If you were given an athletic support strap, wear it as told by your provider. Your provider may give you more instructions. Make sure you know what you can and cannot do.  Contact a health care provider if: You have signs of infection at your incision areas. You have a fever. You have severe pain. You have pain or swelling in your legs. Get help right away if: You have difficulty breathing. This symptom may be an emergency. Get help right away. Call 911. Do not wait to see if the symptom will go away. Do not drive yourself to the hospital. This information is not intended to replace advice given to you by your health care provider. Make sure you discuss any questions you have with your health care provider. Document Revised: 05/15/2022  Document Reviewed: 05/15/2022 Elsevier Patient Education  2024 [employer] General Anesthesia, Adult, Care After The following information offers guidance on how to care for yourself after your procedure. Your health care provider may also give you more specific instructions. If you have problems or questions, contact your health care provider. What can I expect after the procedure? After the procedure, it is common for people to: Have pain or discomfort at the IV site. Have nausea or vomiting. Have a sore throat or hoarseness. Have trouble concentrating. Feel cold or chills. Feel weak, sleepy, or tired (  fatigue). Have soreness and body aches. These can affect parts of the body that were not involved in surgery. Follow these instructions at home: For the time period you were told by your health care provider:  Rest. Do not participate in activities where you could fall or become injured. Do not drive or use machinery. Do not drink alcohol. Do not take sleeping pills or medicines that cause drowsiness. Do not make important decisions or sign legal documents. Do not take care of children on your own. General instructions Drink enough fluid to keep your urine pale yellow. If you have sleep apnea, surgery and certain medicines can increase your risk for breathing problems. Follow instructions from your health care provider about wearing your sleep device: Anytime you are sleeping, including during daytime naps. While taking prescription pain medicines, sleeping medicines, or medicines that make you drowsy. Return to your normal activities as told by your health care provider. Ask your health care provider what activities are safe for you. Take over-the-counter and prescription medicines only as told by your health care provider. Do not use any products that contain nicotine or tobacco. These products include cigarettes, chewing tobacco, and vaping devices, such as e-cigarettes. These can  delay incision healing after surgery. If you need help quitting, ask your health care provider. Contact a health care provider if: You have nausea or vomiting that does not get better with medicine. You vomit every time you eat or drink. You have pain that does not get better with medicine. You cannot urinate or have bloody urine. You develop a skin rash. You have a fever. Get help right away if: You have trouble breathing. You have chest pain. You vomit blood. These symptoms may be an emergency. Get help right away. Call 911. Do not wait to see if the symptoms will go away. Do not drive yourself to the hospital. Summary After the procedure, it is common to have a sore throat, hoarseness, nausea, vomiting, or to feel weak, sleepy, or fatigue. For the time period you were told by your health care provider, do not drive or use machinery. Get help right away if you have difficulty breathing, have chest pain, or vomit blood. These symptoms may be an emergency. This information is not intended to replace advice given to you by your health care provider. Make sure you discuss any questions you have with your health care provider. Document Revised: 11/16/2021 Document Reviewed: 11/16/2021 Elsevier Patient Education  2024 Elsevier Inc.How to Use Chlorhexidine  at Home in the Shower Chlorhexidine  gluconate (CHG) is a germ-killing (antiseptic) wash that's used to clean the skin. It can get rid of the germs that normally live on the skin and can keep them away for about 24 hours. If you're having surgery, you may be told to shower with CHG at home the night before surgery. This can help lower your risk for infection. To use CHG wash in the shower, follow the steps below. Supplies needed: CHG body wash. Clean washcloth. Clean towel. How to use CHG in the shower Follow these steps unless you're told to use CHG in a different way: Start the shower. Use your normal soap and shampoo to wash your face  and hair. Turn off the shower or move out of the shower stream. Pour CHG onto a clean washcloth. Do not use any type of brush or rough sponge. Start at your neck, washing your body down to your toes. Make sure you: Wash the part of your body where the  surgery will be done for at least 1 minute. Do not scrub. Do not use CHG on your head or face unless your health care provider tells you to. If it gets into your ears or eyes, rinse them well with water. Do not wash your genitals with CHG. Wash your back and under your arms. Make sure to wash skin folds. Let the CHG sit on your skin for 1-2 minutes or as long as told. Rinse your entire body in the shower, including all body creases and folds. Turn off the shower. Dry off with a clean towel. Do not put anything on your skin afterward, such as powder, lotion, or perfume. Put on clean clothes or pajamas. If it's the night before surgery, sleep in clean sheets. General tips Use CHG only as told, and follow the instructions on the label. Use the full amount of CHG as told. This is often one bottle. Do not smoke and stay away from flames after using CHG. Your skin may feel sticky after using CHG. This is normal. The sticky feeling will go away as the CHG dries. Do not use CHG: If you have a chlorhexidine  allergy or have reacted to chlorhexidine  in the past. On open wounds or areas of skin that have broken skin, cuts, or scrapes. On babies younger than 70 months of age. Contact a health care provider if: You have questions about using CHG. Your skin gets irritated or itchy. You have a rash after using CHG. You swallow any CHG. Call your local poison control center [phone] in the [place] Your eyes itch badly, or they become very red or swollen. Your hearing changes. You have trouble seeing. If you can't reach your provider, go to an urgent care or emergency room. Do not drive yourself. Get help right away if: You have swelling or tingling  in your mouth or throat. You make high-pitched whistling sounds when you breathe, most often when you breathe out (wheeze). You have trouble breathing. These symptoms may be an emergency. Call 911 right away. Do not wait to see if the symptoms will go away. Do not drive yourself to the hospital. This information is not intended to replace advice given to you by your health care provider. Make sure you discuss any questions you have with your health care provider. Document Revised: 03/04/2023 Document Reviewed: 02/28/2022 Elsevier Patient Education  2024 [employer].

## 2024-04-27 ENCOUNTER — Encounter (HOSPITAL_COMMUNITY)
Admission: RE | Admit: 2024-04-27 | Discharge: 2024-04-27 | Disposition: A | Discharge status: Home / Self Care | Source: Ambulatory Visit | Attending: Urology | Admitting: Urology

## 2024-04-27 ENCOUNTER — Encounter (HOSPITAL_COMMUNITY): Payer: Self-pay

## 2024-04-27 VITALS — BP 125/97 | HR 76 | Temp 97.7°F | Resp 20 | Ht 75.0 in | Wt 211.9 lb

## 2024-04-27 DIAGNOSIS — I10 Essential (primary) hypertension: Secondary | ICD-10-CM | POA: Insufficient documentation

## 2024-04-27 DIAGNOSIS — R9431 Abnormal electrocardiogram [ECG] [EKG]: Secondary | ICD-10-CM | POA: Insufficient documentation

## 2024-04-27 DIAGNOSIS — Z01818 Encounter for other preprocedural examination: Secondary | ICD-10-CM | POA: Insufficient documentation

## 2024-04-27 NOTE — Progress Notes (Signed)
 Patient not given chlorhexidine  wash due to allergy, instructed to cleanse with anti bacterial soap the night before procedure and morning of procedure

## 2024-04-27 NOTE — Progress Notes (Signed)
 EKG shown to Dr. Kendell for PAT clearance. No further orders/intervention

## 2024-04-29 ENCOUNTER — Ambulatory Visit (HOSPITAL_COMMUNITY)
Admission: RE | Admit: 2024-04-29 | Discharge: 2024-04-29 | Disposition: A | Discharge status: Home / Self Care | Attending: Urology | Admitting: Urology

## 2024-04-29 ENCOUNTER — Ambulatory Visit (HOSPITAL_COMMUNITY): Admitting: Certified Registered"

## 2024-04-29 ENCOUNTER — Encounter (HOSPITAL_COMMUNITY)
Admission: RE | Disposition: A | Discharge status: Home / Self Care | Payer: Self-pay | Source: Home / Self Care | Attending: Urology

## 2024-04-29 ENCOUNTER — Encounter (HOSPITAL_COMMUNITY): Payer: Self-pay | Admitting: Urology

## 2024-04-29 ENCOUNTER — Other Ambulatory Visit: Payer: Self-pay

## 2024-04-29 DIAGNOSIS — I861 Scrotal varices: Secondary | ICD-10-CM | POA: Diagnosis present

## 2024-04-29 DIAGNOSIS — I10 Essential (primary) hypertension: Secondary | ICD-10-CM

## 2024-04-29 DIAGNOSIS — Z21 Asymptomatic human immunodeficiency virus [HIV] infection status: Secondary | ICD-10-CM | POA: Insufficient documentation

## 2024-04-29 DIAGNOSIS — F419 Anxiety disorder, unspecified: Secondary | ICD-10-CM | POA: Diagnosis not present

## 2024-04-29 DIAGNOSIS — F17200 Nicotine dependence, unspecified, uncomplicated: Secondary | ICD-10-CM

## 2024-04-29 DIAGNOSIS — F17210 Nicotine dependence, cigarettes, uncomplicated: Secondary | ICD-10-CM | POA: Diagnosis not present

## 2024-04-29 HISTORY — PX: VARICOCELECTOMY: SHX1084

## 2024-04-29 SURGERY — EXCISION, VARICOCELE
Anesthesia: General | Site: Scrotum | Laterality: Left

## 2024-04-29 MED ORDER — OXYCODONE HCL 5 MG/5ML PO SOLN: 5.0000 mg | Freq: Once | ORAL | Status: DC | PRN

## 2024-04-29 MED ORDER — GLYCOPYRROLATE 0.2 MG/ML IJ SOLN
INTRAMUSCULAR | Status: DC | PRN
  Administered 2024-04-29: .2 mg via INTRAVENOUS

## 2024-04-29 MED ORDER — FENTANYL CITRATE (PF) 100 MCG/2ML IJ SOLN
INTRAMUSCULAR | Status: DC | PRN
  Administered 2024-04-29: 100 ug via INTRAVENOUS
  Administered 2024-04-29 (×2): 50 ug via INTRAVENOUS

## 2024-04-29 MED ORDER — LACTATED RINGERS IV SOLN: INTRAVENOUS | Status: DC

## 2024-04-29 MED ORDER — BUPIVACAINE HCL (PF) 0.25 % IJ SOLN
INTRAMUSCULAR | Status: AC
  Filled 2024-04-29: qty 30

## 2024-04-29 MED ORDER — ROCURONIUM BROMIDE 100 MG/10ML IV SOLN
INTRAVENOUS | Status: DC | PRN
  Administered 2024-04-29 (×2): 10 mg via INTRAVENOUS
  Administered 2024-04-29: 40 mg via INTRAVENOUS

## 2024-04-29 MED ORDER — PROPOFOL 10 MG/ML IV BOLUS
INTRAVENOUS | Status: DC | PRN
  Administered 2024-04-29: 250 mg via INTRAVENOUS

## 2024-04-29 MED ORDER — ONDANSETRON HCL 4 MG/2ML IJ SOLN
4.0000 mg | Freq: Once | INTRAMUSCULAR | Status: AC | PRN
  Administered 2024-04-29: 4 mg via INTRAVENOUS

## 2024-04-29 MED ORDER — GLYCOPYRROLATE PF 0.2 MG/ML IJ SOSY
PREFILLED_SYRINGE | INTRAMUSCULAR | Status: AC
  Filled 2024-04-29: qty 1

## 2024-04-29 MED ORDER — DEXMEDETOMIDINE HCL IN NACL 80 MCG/20ML IV SOLN
INTRAVENOUS | Status: AC
  Filled 2024-04-29: qty 20

## 2024-04-29 MED ORDER — LIDOCAINE 2% (20 MG/ML) 5 ML SYRINGE
INTRAMUSCULAR | Status: AC
  Filled 2024-04-29: qty 5

## 2024-04-29 MED ORDER — SUGAMMADEX SODIUM 200 MG/2ML IV SOLN
INTRAVENOUS | Status: DC | PRN
  Administered 2024-04-29: 200 mg via INTRAVENOUS

## 2024-04-29 MED ORDER — PHENYLEPHRINE 80 MCG/ML (10ML) SYRINGE FOR IV PUSH (FOR BLOOD PRESSURE SUPPORT)
PREFILLED_SYRINGE | INTRAVENOUS | Status: AC
  Filled 2024-04-29: qty 30

## 2024-04-29 MED ORDER — FENTANYL CITRATE (PF) 250 MCG/5ML IJ SOLN
INTRAMUSCULAR | Status: AC
Start: 2024-04-29 — End: 2024-04-29
  Filled 2024-04-29: qty 5

## 2024-04-29 MED ORDER — OXYCODONE HCL 5 MG PO TABS: 5.0000 mg | ORAL_TABLET | Freq: Once | ORAL | Status: DC | PRN

## 2024-04-29 MED ORDER — SUGAMMADEX SODIUM 200 MG/2ML IV SOLN
INTRAVENOUS | Status: AC
  Filled 2024-04-29: qty 2

## 2024-04-29 MED ORDER — MIDAZOLAM HCL 5 MG/5ML IJ SOLN
INTRAMUSCULAR | Status: DC | PRN
  Administered 2024-04-29: 2 mg via INTRAVENOUS

## 2024-04-29 MED ORDER — ORAL CARE MOUTH RINSE: 15.0000 mL | Freq: Once | OROMUCOSAL | Status: DC

## 2024-04-29 MED ORDER — CEFAZOLIN SODIUM-DEXTROSE 2-4 GM/100ML-% IV SOLN
INTRAVENOUS | Status: AC
  Filled 2024-04-29: qty 100

## 2024-04-29 MED ORDER — CEFAZOLIN SODIUM-DEXTROSE 2-4 GM/100ML-% IV SOLN
2.0000 g | INTRAVENOUS | Status: AC
  Administered 2024-04-29: 2 g via INTRAVENOUS

## 2024-04-29 MED ORDER — MIDAZOLAM HCL 2 MG/2ML IJ SOLN
INTRAMUSCULAR | Status: AC
Start: 2024-04-29 — End: 2024-04-29
  Filled 2024-04-29: qty 2

## 2024-04-29 MED ORDER — HEMOSTATIC AGENTS (NO CHARGE) OPTIME
TOPICAL | Status: DC | PRN
  Administered 2024-04-29: 1 via TOPICAL

## 2024-04-29 MED ORDER — SODIUM CHLORIDE 0.9 % IR SOLN
Status: DC | PRN
  Administered 2024-04-29: 1000 mL

## 2024-04-29 MED ORDER — SUCCINYLCHOLINE CHLORIDE 20 MG/ML IJ SOLN
INTRAMUSCULAR | Status: DC | PRN
  Administered 2024-04-29: 160 mg via INTRAVENOUS

## 2024-04-29 MED ORDER — BUPIVACAINE HCL (PF) 0.25 % IJ SOLN
INTRAMUSCULAR | Status: DC | PRN
  Administered 2024-04-29: 10 mL

## 2024-04-29 MED ORDER — ROCURONIUM BROMIDE 10 MG/ML (PF) SYRINGE
PREFILLED_SYRINGE | INTRAVENOUS | Status: AC
  Filled 2024-04-29: qty 10

## 2024-04-29 MED ORDER — PROPOFOL 10 MG/ML IV BOLUS
INTRAVENOUS | Status: AC
Start: 2024-04-29 — End: 2024-04-29
  Filled 2024-04-29: qty 20

## 2024-04-29 MED ORDER — OXYCODONE-ACETAMINOPHEN 5-325 MG PO TABS
1.0000 | ORAL_TABLET | ORAL | 0 refills | Status: DC | PRN
Start: 2024-04-29 — End: 2024-05-08

## 2024-04-29 MED ORDER — DEXMEDETOMIDINE HCL IN NACL 80 MCG/20ML IV SOLN
INTRAVENOUS | Status: DC | PRN
  Administered 2024-04-29: 12 ug via INTRAVENOUS

## 2024-04-29 MED ORDER — FENTANYL CITRATE PF 50 MCG/ML IJ SOSY: 25.0000 ug | PREFILLED_SYRINGE | INTRAMUSCULAR | Status: DC | PRN

## 2024-04-29 MED ORDER — CHLORHEXIDINE GLUCONATE 0.12 % MT SOLN: 15.0000 mL | Freq: Once | OROMUCOSAL | Status: DC

## 2024-04-29 MED ORDER — PHENYLEPHRINE HCL (PRESSORS) 10 MG/ML IV SOLN
INTRAVENOUS | Status: DC | PRN
  Administered 2024-04-29 (×3): 80 ug via INTRAVENOUS

## 2024-04-29 SURGICAL SUPPLY — 27 items
COVER LIGHT HANDLE STERIS (MISCELLANEOUS) ×2 IMPLANT
DERMABOND ADVANCED .7 DNX12 (GAUZE/BANDAGES/DRESSINGS) ×1 IMPLANT
DRAIN PENROSE 12X.25 LTX STRL (MISCELLANEOUS) ×1 IMPLANT
ELECT NDL BLADE 2-5/6 (NEEDLE) ×1 IMPLANT
ELECT NEEDLE BLADE 2-5/6 (NEEDLE) ×1 IMPLANT
ELECTRODE REM PT RTRN 9FT ADLT (ELECTROSURGICAL) ×1 IMPLANT
GLOVE BIO SURGEON STRL SZ8 (GLOVE) ×1 IMPLANT
GLOVE BIOGEL PI IND STRL 7.0 (GLOVE) ×2 IMPLANT
GOWN STRL REUS W/TWL LRG LVL3 (GOWN DISPOSABLE) ×2 IMPLANT
GOWN STRL REUS W/TWL XL LVL3 (GOWN DISPOSABLE) ×1 IMPLANT
KIT TURNOVER KIT A (KITS) ×1 IMPLANT
LOOP VESSEL MAXI BLUE (MISCELLANEOUS) ×1 IMPLANT
MANIFOLD NEPTUNE II (INSTRUMENTS) ×1 IMPLANT
NDL HYPO 25X1 1.5 SAFETY (NEEDLE) ×1 IMPLANT
NEEDLE HYPO 25X1 1.5 SAFETY (NEEDLE) ×1 IMPLANT
NS IRRIG 500ML POUR BTL (IV SOLUTION) IMPLANT
PACK MINOR (CUSTOM PROCEDURE TRAY) ×1 IMPLANT
PAD ARMBOARD POSITIONER FOAM (MISCELLANEOUS) ×1 IMPLANT
POSITIONER HEAD 8X9X4 ADT (SOFTGOODS) ×1 IMPLANT
POWDER SURGICEL 3.0 GRAM (HEMOSTASIS) IMPLANT
SET BASIN LINEN APH (SET/KITS/TRAYS/PACK) ×1 IMPLANT
SOL PREP PROV IODINE SCRUB 4OZ (MISCELLANEOUS) ×1 IMPLANT
SUPPORT SCROTAL LG NO STRP (MISCELLANEOUS) IMPLANT
SUT MNCRL AB 4-0 PS2 18 (SUTURE) ×1 IMPLANT
SUT SILK 2-0 18XBRD TIE 12 (SUTURE) ×1 IMPLANT
SUT VIC AB 3-0 SH 27X BRD (SUTURE) ×1 IMPLANT
SYR CONTROL 10ML LL (SYRINGE) ×1 IMPLANT

## 2024-04-29 NOTE — H&P (Signed)
 HPI: Mr Kyle Gill is a 43yo here for left varicocelectomy. Starting 4 months ago he developed left testicular pain and difficulty getting an erection. He underwent scrotal US  01/30/2024 which showed a left varicocele.  He continues to have dull left testicular pain that flares.      PMH:     Past Medical History:  Diagnosis Date   Bell's palsy     HIV positive (HCC)     Hypertension     Pneumonia            Surgical History:      Past Surgical History:  Procedure Laterality Date   COLONOSCOPY WITH PROPOFOL  N/A 03/17/2023    Procedure: COLONOSCOPY WITH PROPOFOL ;  Surgeon: Shaaron Lamar HERO, MD;  Location: [facility];  Service: Endoscopy;  Laterality: N/A;  1:00 pm, asa 2   SHOULDER ARTHROSCOPY WITH LABRAL REPAIR Right 06/13/2016    Procedure: SHOULDER DIAGNOSTIC OPERATIVE ARTHROSCOPY WITH LABRAL REPAIR;  Surgeon: Glendia Cordella Hutchinson, MD;  Location: [facility] OR;  Service: Orthopedics;  Laterality: Right;          Home Medications:  Allergies as of 03/17/2024         Reactions    Aspirin Itching, Other (See Comments)    Dry mouth    Penicillins Itching, Other (See Comments)    Inflamed skin around genitalia    Sulfamethoxazole-trimethoprim Itching, Other (See Comments)    Inflamed skin around genitalia    Chlorhexidine  Hives            Medication List           Accurate as of March 17, 2024  9:55 AM. If you have any questions, ask your nurse or doctor.              clonazePAM  0.5 MG tablet Commonly known as: KLONOPIN  Take 1 tablet (0.5 mg total) by mouth 2 (two) times daily as needed for anxiety.    ibuprofen  200 MG tablet Commonly known as: ADVIL  Take 200 mg by mouth every 8 (eight) hours as needed (pain.).    olmesartan  20 MG tablet Commonly known as: BENICAR  TAKE 1 TABLET BY MOUTH EVERY DAY    ondansetron  4 MG tablet Commonly known as: Zofran  Take 1 tablet (4 mg total) by mouth every 8 (eight) hours as needed for nausea or vomiting.    oseltamivir  75 MG  capsule Commonly known as: Tamiflu  Take 1 capsule (75 mg total) by mouth 2 (two) times daily.             Allergies:  Allergies       Allergies  Allergen Reactions   Aspirin Itching and Other (See Comments)      Dry mouth   Penicillins Itching and Other (See Comments)      Inflamed skin around genitalia   Sulfamethoxazole-Trimethoprim Itching and Other (See Comments)      Inflamed skin around genitalia   Chlorhexidine  Hives        Family History:      Family History  Problem Relation Age of Onset   Colon polyps Father          Numerous   Cancer Sister          Not sure of cancer type but thinks it involved her intestine   Inflammatory bowel disease Neg Hx            Social History:  reports that he has been smoking cigarettes. He has never used smokeless tobacco. He reports  current alcohol use. He reports current drug use. Drug: Marijuana.   ROS: All other review of systems were reviewed and are negative except what is noted above in HPI   Physical Exam: BP (!) 136/90   Pulse 81   Constitutional:  Alert and oriented, No acute distress. HEENT: [place] AT, moist mucus membranes.  Trachea midline, no masses. Cardiovascular: No clubbing, cyanosis, or edema. Respiratory: Normal respiratory effort, no increased work of breathing. GI: Abdomen is soft, nontender, nondistended, no abdominal masses GU: No CVA tenderness. Circumcised phallus. No masses/lesions on penis, testis, scrotum. Large left varicocele  Lymph: No cervical or inguinal lymphadenopathy. Skin: No rashes, bruises or suspicious lesions. Neurologic: Grossly intact, no focal deficits, moving all 4 extremities. Psychiatric: Normal mood and affect.   Laboratory Data: Recent Labs       Lab Results  Component Value Date    WBC 4.4 02/25/2023    HGB 18.4 (H) 02/25/2023    HCT 53.7 (H) 02/25/2023    MCV 91 02/25/2023    PLT 243 02/25/2023        Recent Labs       Lab Results  Component Value Date     CREATININE 0.99 11/05/2021        Recent Labs  No results found for: PSA     Recent Labs  No results found for: TESTOSTERONE     Recent Labs       Lab Results  Component Value Date    HGBA1C 5.9 (H) 12/05/2021        Urinalysis Labs (Brief)          Component Value Date/Time    COLORURINE AMBER BIOCHEMICALS MAY BE AFFECTED BY COLOR (A) 09/10/2010 1620    APPEARANCEUR Clear 01/22/2024 0940    LABSPEC >1.030 (H) 09/10/2010 1620    PHURINE 5.0 09/10/2010 1620    GLUCOSEU Negative 01/22/2024 0940    HGBUR LARGE (A) 09/10/2010 1620    BILIRUBINUR Negative 01/22/2024 0940    KETONESUR TRACE (A) 09/10/2010 1620    PROTEINUR 1+ (A) 01/22/2024 0940    PROTEINUR NEGATIVE 09/10/2010 1620    UROBILINOGEN 0.2 09/10/2010 1620    NITRITE Negative 01/22/2024 0940    NITRITE NEGATIVE 09/10/2010 1620    LEUKOCYTESUR Trace (A) 01/22/2024 0940        Recent Labs       Lab Results  Component Value Date    LABMICR See below: 01/22/2024    WBCUA None seen 01/22/2024    LABEPIT None seen 01/22/2024    BACTERIA None seen 01/22/2024        Pertinent Imaging: Scrotal US  01/30/2024: Images reviewed and discussed with the patient No results found for this or any previous visit.   No results found for this or any previous visit.   No results found for this or any previous visit.   No results found for this or any previous visit.   No results found for this or any previous visit.   No results found for this or any previous visit.   No results found for this or any previous visit.   No results found for this or any previous visit.     Assessment & Plan:     1. varicocele The risks/benefits/alternaitves to left varicocelectomy was explained to the patient and he understands and wishes to proceed with surgery - Urinalysis, Routine w reflex microscopic

## 2024-04-29 NOTE — Anesthesia Postprocedure Evaluation (Signed)
 Anesthesia Post Note  Patient: Kyle Gill  Procedure(s) Performed: EXCISION, VARICOCELE (Left: Scrotum)  Patient location during evaluation: PACU Anesthesia Type: General Level of consciousness: awake, oriented, awake and alert and patient cooperative Pain management: pain level controlled Vital Signs Assessment: post-procedure vital signs reviewed and stable Respiratory status: spontaneous breathing and nonlabored ventilation Cardiovascular status: blood pressure returned to baseline and stable Anesthetic complications: no   No notable events documented.   Last Vitals:  Vitals:   04/29/24 0808 04/29/24 0830  BP: (!) 137/101 (!) 138/99  Pulse: (!) 57 (!) 57  Resp: 17 14  Temp: 36.6 C   SpO2: 97% 97%    Last Pain:  Vitals:   04/29/24 0808  TempSrc: Oral  PainSc: 0-No pain                 Adine LITTIE Anger

## 2024-04-29 NOTE — Transfer of Care (Signed)
 Immediate Anesthesia Transfer of Care Note  Patient: Kyle Gill  Procedure(s) Performed: EXCISION, VARICOCELE (Left: Scrotum)  Patient Location: PACU  Anesthesia Type:General  Level of Consciousness: awake, alert , oriented, and patient cooperative  Airway & Oxygen  Therapy: Patient Spontanous Breathing and Patient connected to nasal cannula oxygen   Post-op Assessment: Report given to RN and Post -op Vital signs reviewed and stable  Post vital signs: Reviewed and stable  Last Vitals:  Vitals Value Taken Time  BP 118/82 04/29/24 11:16  Temp    Pulse 66 04/29/24 11:17  Resp 25 04/29/24 11:18  SpO2 96 % 04/29/24 11:17  Vitals shown include unfiled device data.  Last Pain:  Vitals:   04/29/24 0808  TempSrc: Oral  PainSc: 0-No pain      Patients Stated Pain Goal: 3 (04/29/24 9191)  Complications: No notable events documented.

## 2024-04-29 NOTE — Anesthesia Preprocedure Evaluation (Signed)
 Anesthesia Evaluation  Patient identified by MRN, date of birth, ID band Patient awake    Reviewed: Allergy & Precautions, H&P , NPO status , Patient's Chart, lab work & pertinent test results, reviewed documented beta blocker date and time   Airway Mallampati: II  TM Distance: >3 FB Neck ROM: full    Dental no notable dental hx.    Pulmonary pneumonia, Current Smoker and Patient abstained from smoking.   Pulmonary exam normal breath sounds clear to auscultation       Cardiovascular Exercise Tolerance: Good hypertension,  Rhythm:regular Rate:Normal     Neuro/Psych  PSYCHIATRIC DISORDERS Anxiety Depression     Neuromuscular disease    GI/Hepatic negative GI ROS, Neg liver ROS,,,  Endo/Other  negative endocrine ROS    Renal/GU negative Renal ROS  negative genitourinary   Musculoskeletal   Abdominal   Peds  Hematology  (+) HIV  Anesthesia Other Findings   Reproductive/Obstetrics negative OB ROS                              Anesthesia Physical Anesthesia Plan  ASA: 3  Anesthesia Plan: General and General LMA   Post-op Pain Management:    Induction:   PONV Risk Score and Plan: Ondansetron   Airway Management Planned:   Additional Equipment:   Intra-op Plan:   Post-operative Plan:   Informed Consent: I have reviewed the patients History and Physical, chart, labs and discussed the procedure including the risks, benefits and alternatives for the proposed anesthesia with the patient or authorized representative who has indicated his/her understanding and acceptance.     Dental Advisory Given  Plan Discussed with: CRNA  Anesthesia Plan Comments:         Anesthesia Quick Evaluation

## 2024-04-29 NOTE — Addendum Note (Signed)
 Addendum  created 04/29/24 1129 by Pheobe Adine CROME, CRNA   LDA properties accepted

## 2024-04-29 NOTE — Addendum Note (Signed)
 Addendum  created 04/29/24 1140 by Cordella Elvie HERO, CRNA   Flowsheet accepted

## 2024-04-29 NOTE — Op Note (Signed)
 Preoperative diagnosis: left varicocele,   Postoperative diagnosis: Same  Procedure: Left varicocelectomy  Attending: Belvie Clara, MD  Anesthesia: General  History of blood loss: 5cc  Antibiotics: ancef   Drains: none  Specimens: none  Findings: numerous tortuous veins in spermatic cord. Multiple spermatic cord nerve ligated. Testicular artery identified and preserved using doppler.  Indications: Patient is a 43 year old male with a history of left testicular pain who was found on ultrasound to have a Grade 3 varicocele.  After discussing treatment options he decided to proceed with varicocelectomy.   Procedure in detail: Prior to procedure consent was obtained.  Patient was brought to the operating room and a brief timeout was done to ensure correct patient, correct procedure, correct site.  General anesthesia was administered and patient was placed in supine position.  His genitalia and abdomen was then prepped and draped in usual sterile fashion.  A 4 cm incision was made in the subinguinal region.  We dissected down through the subcutaneous tissue to until we reached the spermatic cord.  The spermatic cord was identified and a Penrose drain was placed under the cord.  We then opened the cord with sharp dissection. We proceeded to identify the testicular artery with the doppler. Once the artery was identified and loop was placed around it and it was excluded from the remainder of the spermatic cord. We then proceeded to ligate the veins in the cord with 3-0 silk ties. We also ligated the vas deferens with 0 silk tie. Once all the veins were ligated we once again check for flow in the testicular artery and we noted good flow. We then inspected the operative field and no residual bleeding was noted. We then closed the subcutaneous tissues in 2 layers with 2-0 Vicryl in a running fashion.  We then closed the skin with 4-0 Monocryl in a running fashion.  Skin glue was then placed over the  incision. We then placed a scrotal fluff and this then concluded the procedure which was well tolerated by the patient.  Complications: None  Condition: Stable, extubated, transferred to PACU.  Plan: Patient is to be discharged home.  He is to follow up in 2 weeks for wound check.

## 2024-05-06 ENCOUNTER — Other Ambulatory Visit: Payer: Self-pay | Admitting: Urology

## 2024-05-06 NOTE — Telephone Encounter (Signed)
 Pt is made aware message sent to Provider Md, McKenzie. Pt voiced understanding.

## 2024-05-06 NOTE — Telephone Encounter (Signed)
 The patient called to request a medication refill of oxyCODONE -acetaminophen  (PERCOCET) 5-325 MG tablet [[id number]] . Patient would like the medication sent to [facility].   Patient had EXCISION, VARICOCELE done on 04/29/24 , he is still having a lot of pain and is out of his pain medication

## 2024-05-08 ENCOUNTER — Encounter (HOSPITAL_COMMUNITY): Payer: Self-pay

## 2024-05-08 ENCOUNTER — Other Ambulatory Visit: Payer: Self-pay

## 2024-05-08 ENCOUNTER — Emergency Department (HOSPITAL_COMMUNITY)
Admission: EM | Admit: 2024-05-08 | Discharge: 2024-05-08 | Disposition: A | Discharge status: Home / Self Care | Attending: Emergency Medicine | Admitting: Emergency Medicine

## 2024-05-08 DIAGNOSIS — L7634 Postprocedural seroma of skin and subcutaneous tissue following other procedure: Secondary | ICD-10-CM | POA: Diagnosis present

## 2024-05-08 MED ORDER — OXYCODONE-ACETAMINOPHEN 5-325 MG PO TABS
1.0000 | ORAL_TABLET | Freq: Once | ORAL | Status: AC
  Administered 2024-05-08: 1 via ORAL
  Filled 2024-05-08: qty 1

## 2024-05-08 MED ORDER — OXYCODONE-ACETAMINOPHEN 5-325 MG PO TABS: 1.0000 | ORAL_TABLET | ORAL | 0 refills | Status: DC | PRN

## 2024-05-08 NOTE — ED Triage Notes (Signed)
 Pt recently had varicosectomy a week ago and the glue has come loose in one small area. Pt is also bleeding a small amount.

## 2024-05-08 NOTE — Discharge Instructions (Signed)
 Return if any problems.

## 2024-05-08 NOTE — ED Provider Notes (Signed)
 [place] EMERGENCY DEPARTMENT AT [facility] Provider Note   CSN: [id number] Arrival date & time: 05/08/24  1559     Patient presents with: Wound Dehiscence   Kyle Gill is a 43 y.o. male.   Patient reports that he had surgery for a left varicocele on 8/28.  Patient reports that the incision began draining fluid today. Pt reports he is still having pain.  Pt is out of pain medication.  Pt reports no redness or swelling.  Pt denies any upper abdominal pain.  Pt denies fever or chills   The history is provided by the patient. No language interpreter was used.       Prior to Admission medications   Medication Sig Start Date End Date Taking? Authorizing Provider  oxyCODONE -acetaminophen  (PERCOCET) 5-325 MG tablet Take 1 tablet by mouth every 4 (four) hours as needed for severe pain (pain score 7-10). 05/08/24 05/08/25 Yes Cem Kosman K, PA-C  cetirizine (ZYRTEC) 10 MG tablet Take 10 mg by mouth daily.    [provider]  clonazePAM  (KLONOPIN ) 0.5 MG tablet Take 1 tablet (0.5 mg total) by mouth 2 (two) times daily as needed for anxiety. Patient not taking: Reported on 04/21/2024 02/25/23   Zarwolo, Gloria, FNP  olmesartan  (BENICAR ) 20 MG tablet TAKE 1 TABLET BY MOUTH EVERY DAY 11/12/23   Zarwolo, Gloria, FNP  ondansetron  (ZOFRAN ) 4 MG tablet Take 1 tablet (4 mg total) by mouth every 8 (eight) hours as needed for nausea or vomiting. 02/25/23   Zarwolo, Gloria, FNP  Polyethyl Glycol-Propyl Glycol (SYSTANE OP) Place 1 drop into both eyes as needed (dry eyes).    [provider]  tadalafil  (CIALIS ) 5 MG tablet Take 1 tablet (5 mg total) by mouth daily as needed for erectile dysfunction. 03/17/24   Sherrilee Belvie CROME, MD    Allergies: Aspirin, Penicillins, Sulfamethoxazole-trimethoprim, and Chlorhexidine     Review of Systems  All other systems reviewed and are negative.   Updated Vital Signs BP (!) 156/96   Pulse 93   Ht 6' 3 (1.905 m)   Wt 96.2 kg   SpO2 100%    BMI 26.50 kg/m   Physical Exam Vitals reviewed.  Constitutional:      Appearance: Normal appearance.  HENT:     Nose: Nose normal.  Cardiovascular:     Rate and Rhythm: Normal rate.  Pulmonary:     Effort: Pulmonary effort is normal.  Abdominal:     General: Abdomen is flat.     Palpations: Abdomen is soft.     Comments: Incision left lower abdomen,  small amount of red tinged serous appearing drainage.    Musculoskeletal:        General: Normal range of motion.     Cervical back: Normal range of motion.  Neurological:     General: No focal deficit present.     Mental Status: He is alert.     (all labs ordered are listed, but only abnormal results are displayed) Labs Reviewed - No data to display  EKG: None  Radiology: No results found.   Procedures   Medications Ordered in the ED  oxyCODONE -acetaminophen  (PERCOCET/ROXICET) 5-325 MG per tablet 1 tablet (1 tablet Oral Given 05/08/24 1752)                                    Medical Decision Making Patient had surgery on 8/28 for a left-sided varicocele.  Patient reports that  he began having some drainage at the site today.  Patient complains of pain and is out of pain medication.  Risk Prescription drug management. Risk Details: Wound does not appear infected, there is a small amount of serous drainage.  Patient is advised to keep the area covered.  He is given a prescription for pain medicine I advised that he call Dr. Little office on Monday to schedule to be seen.  Return to the emergency department if any fever chills anything that looks like pus any redness or swelling of the area        Final diagnoses:  Postoperative seroma of skin after non-dermatologic procedure    ED Discharge Orders          Ordered    oxyCODONE -acetaminophen  (PERCOCET) 5-325 MG tablet  Every 4 hours PRN        05/08/24 1805            An After Visit Summary was printed and given to the patient.    Kyle Gill 05/08/24 Kyle Cleotilde Rogue, MD 05/09/24 [phone]

## 2024-05-11 NOTE — Telephone Encounter (Signed)
 Pt is made aware Rx for Oxycodone  was denial. Verbalize understanding

## 2024-05-12 ENCOUNTER — Encounter: Payer: Self-pay | Admitting: Urology

## 2024-05-12 ENCOUNTER — Ambulatory Visit: Admitting: Urology

## 2024-05-12 VITALS — BP 128/75 | HR 83

## 2024-05-12 DIAGNOSIS — I861 Scrotal varices: Secondary | ICD-10-CM

## 2024-05-12 DIAGNOSIS — N5089 Other specified disorders of the male genital organs: Secondary | ICD-10-CM

## 2024-05-12 LAB — URINALYSIS, ROUTINE W REFLEX MICROSCOPIC
Bilirubin, UA: NEGATIVE
Glucose, UA: NEGATIVE
Ketones, UA: NEGATIVE
Leukocytes,UA: NEGATIVE
Nitrite, UA: NEGATIVE
Specific Gravity, UA: 1.025 (ref 1.005–1.030)
Urobilinogen, Ur: 0.2 mg/dL (ref 0.2–1.0)
pH, UA: 6 (ref 5.0–7.5)

## 2024-05-12 LAB — MICROSCOPIC EXAMINATION
Bacteria, UA: NONE SEEN
Epithelial Cells (non renal): NONE SEEN /HPF (ref 0–10)
WBC, UA: NONE SEEN /HPF (ref 0–5)

## 2024-05-12 MED ORDER — OXYCODONE-ACETAMINOPHEN 5-325 MG PO TABS: 1.0000 | ORAL_TABLET | ORAL | 0 refills | Status: DC | PRN

## 2024-05-12 NOTE — Patient Instructions (Signed)
 Varicocele  A varicocele is a swelling of veins in the scrotum. The scrotum is the sac that contains the testicles. Varicoceles can occur on either side of the scrotum, but they are more common on the left side. They occur most often in teenage boys and young men. In most cases, varicoceles are not a serious problem. They are usually small and painless and do not require treatment. Tests may be done to confirm the diagnosis. Treatment may be needed if: A varicocele is large, causes a lot of pain, or causes pain when exercising. Varicoceles are found on both sides of the scrotum. A varicocele causes a decrease in the size of the testicle in a growing adolescent. A varicocele makes it hard to get someone pregnant (infertility). What are the causes? This condition is caused by valves in the veins not working properly. Valves in the veins help to return blood from the scrotum and testicles to the heart. If these valves do not work well, blood flows backward and backs up into the veins, which causes the veins to swell. This is similar to what happens when varicose veins form in the leg. What are the signs or symptoms? Most varicoceles do not cause any symptoms. If symptoms do occur, they may include: Swelling on one side of the scrotum. The swelling may be more obvious when you are standing up. A lumpy feeling in the scrotum. A heavy feeling on one side of the scrotum. A dull ache in the scrotum, especially after exercise or prolonged standing or sitting. Slower growth or reduced size of the testicle on the side of the varicocele. This happens in young males. Infertility. This can occur if the testicle does not grow normally or if the condition causes problems with the sperm, such as a low sperm count or sperm that are not able to reach the egg. How is this diagnosed? This condition is diagnosed based on: Your medical history. A physical exam. Your health care provider may check and feel the scrotum  area to check for swollen or enlarged veins. An ultrasound. This may be done to confirm the diagnosis and to help rule out other causes of the swelling. How is this treated? Treatment is usually not needed for this condition. If you have any pain, your provider may give you medicine to help relieve it. Your provider may need to do tests to make sure that your varicocele does not cause problems. If further treatment is needed, you may have one of these procedures: Varicocelectomy. This is surgery to tie off the swollen veins so that the flow of blood goes to other veins instead. Embolization. This procedure uses a soft tube (catheter) to place metal coils or other devices to block the veins. This cuts off the blood flow to the swollen veins. Follow these instructions at home: Take over-the-counter and prescription medicines only as told by your provider. Wear supportive underwear. Use an athletic supporter when doing sports activities. Contact a health care provider if: Your pain is increasing. You have redness in the affected area. Your testicle is enlarged, swollen, or painful. You have swelling that does not get better when you are lying down. One of your testicles is smaller than the other. You develop swelling in your legs. Get help right away if: You have difficulty breathing. This symptom may be an emergency. Get help right away. Call 911. Do not wait to see if the symptom will go away. Do not drive yourself to the hospital. This information  is not intended to replace advice given to you by your health care provider. Make sure you discuss any questions you have with your health care provider. Document Revised: 05/15/2022 Document Reviewed: 05/15/2022 Elsevier Patient Education  2024 [employer].

## 2024-05-12 NOTE — Progress Notes (Signed)
 05/12/2024 8:52 AM   Kyle Gill May 17, 1981 [id number]  Referring provider: Zarwolo, Gloria, FNP [address] [place],  [place] [address]  Followup varicocele   HPI: Mr Kyle Gill is a 43yo here for followup after varicocelectomy. He has clear drainage from the incision. He is having mild left testis pain   PMH: Past Medical History:  Diagnosis Date   Bell's palsy    HIV positive (HCC)    Hypertension    Pneumonia     Surgical History: Past Surgical History:  Procedure Laterality Date   COLONOSCOPY WITH PROPOFOL  N/A 03/17/2023   Procedure: COLONOSCOPY WITH PROPOFOL ;  Surgeon: Kyle Lamar HERO, MD;  Location: [facility] SUITE;  Service: Endoscopy;  Laterality: N/A;  1:00 pm, asa 2   SHOULDER ARTHROSCOPY WITH LABRAL REPAIR Right 06/13/2016   Procedure: SHOULDER DIAGNOSTIC OPERATIVE ARTHROSCOPY WITH LABRAL REPAIR;  Surgeon: Glendia Cordella Hutchinson, MD;  Location: [facility] OR;  Service: Orthopedics;  Laterality: Right;   VARICOCELECTOMY Left 04/29/2024   Procedure: EXCISION, VARICOCELE;  Surgeon: Sherrilee Belvie CROME, MD;  Location: [facility] ORS;  Service: Urology;  Laterality: Left;    Home Medications:  Allergies as of 05/12/2024       Reactions   Aspirin Itching, Other (See Comments)   Dry mouth   Penicillins Itching, Other (See Comments)   Inflamed skin around genitalia   Sulfamethoxazole-trimethoprim Itching, Other (See Comments)   Inflamed skin around genitalia   Chlorhexidine  Hives        Medication List        Accurate as of May 12, 2024  8:52 AM. If you have any questions, ask your nurse or doctor.          cetirizine 10 MG tablet Commonly known as: ZYRTEC Take 10 mg by mouth daily.   clonazePAM  0.5 MG tablet Commonly known as: KLONOPIN  Take 1 tablet (0.5 mg total) by mouth 2 (two) times daily as needed for anxiety.   olmesartan  20 MG tablet Commonly known as: BENICAR  TAKE 1 TABLET BY MOUTH EVERY DAY   ondansetron  4 MG tablet Commonly known as: Zofran  Take 1  tablet (4 mg total) by mouth every 8 (eight) hours as needed for nausea or vomiting.   oxyCODONE -acetaminophen  5-325 MG tablet Commonly known as: Percocet Take 1 tablet by mouth every 4 (four) hours as needed for severe pain (pain score 7-10).   SYSTANE OP Place 1 drop into both eyes as needed (dry eyes).   tadalafil  5 MG tablet Commonly known as: CIALIS  Take 1 tablet (5 mg total) by mouth daily as needed for erectile dysfunction.        Allergies:  Allergies  Allergen Reactions   Aspirin Itching and Other (See Comments)    Dry mouth   Penicillins Itching and Other (See Comments)    Inflamed skin around genitalia   Sulfamethoxazole-Trimethoprim Itching and Other (See Comments)    Inflamed skin around genitalia   Chlorhexidine  Hives    Family History: Family History  Problem Relation Age of Onset   Colon polyps Father        Numerous   Cancer Sister        Not sure of cancer type but thinks it involved her intestine   Inflammatory bowel disease Neg Hx     Social History:  reports that he has been smoking cigarettes. He has never used smokeless tobacco. He reports current alcohol use. He reports current drug use. Drug: Marijuana.  ROS: All other review of systems were reviewed  and are negative except what is noted above in HPI  Physical Exam: BP 128/75   Pulse 83   Constitutional:  Alert and oriented, No acute distress. HEENT: Kyle Gill AT, moist mucus membranes.  Trachea midline, no masses. Cardiovascular: No clubbing, cyanosis, or edema. Respiratory: Normal respiratory effort, no increased work of breathing. GI: Abdomen is soft, nontender, nondistended, no abdominal masses GU: No CVA tenderness.  Healing left inguinal incision. Mild left scrotal edema Lymph: No cervical or inguinal lymphadenopathy. Skin: No rashes, bruises or suspicious lesions. Neurologic: Grossly intact, no focal deficits, moving all 4 extremities. Psychiatric: Normal mood and affect.  Laboratory  Data: Lab Results  Component Value Date   WBC 4.4 02/25/2023   HGB 18.4 (H) 02/25/2023   HCT 53.7 (H) 02/25/2023   MCV 91 02/25/2023   PLT 243 02/25/2023    Lab Results  Component Value Date   CREATININE 0.99 11/05/2021    No results found for: PSA  No results found for: TESTOSTERONE  Lab Results  Component Value Date   HGBA1C 5.9 (H) 12/05/2021    Urinalysis    Component Value Date/Time   COLORURINE AMBER BIOCHEMICALS MAY BE AFFECTED BY COLOR (A) 09/10/2010 1620   APPEARANCEUR Clear 03/17/2024 0941   LABSPEC >1.030 (H) 09/10/2010 1620   PHURINE 5.0 09/10/2010 1620   GLUCOSEU Negative 03/17/2024 0941   HGBUR LARGE (A) 09/10/2010 1620   BILIRUBINUR Negative 03/17/2024 0941   KETONESUR TRACE (A) 09/10/2010 1620   PROTEINUR 1+ (A) 03/17/2024 0941   PROTEINUR NEGATIVE 09/10/2010 1620   UROBILINOGEN 0.2 09/10/2010 1620   NITRITE Negative 03/17/2024 0941   NITRITE NEGATIVE 09/10/2010 1620   LEUKOCYTESUR Negative 03/17/2024 0941    Lab Results  Component Value Date   LABMICR See below: 03/17/2024   WBCUA None seen 03/17/2024   LABEPIT 0-10 03/17/2024   BACTERIA None seen 03/17/2024    Pertinent Imaging:  No results found for this or any previous visit.  No results found for this or any previous visit.  No results found for this or any previous visit.  No results found for this or any previous visit.  No results found for this or any previous visit.  No results found for this or any previous visit.  No results found for this or any previous visit.  No results found for this or any previous visit.   Assessment & Plan:    1. Varicocele -refill pain medication -followup 2-3 months - Urinalysis, Routine w reflex microscopic   No follow-ups on file.  Belvie Clara, MD  [facility] [place]

## 2024-05-17 ENCOUNTER — Telehealth: Payer: Self-pay

## 2024-05-17 NOTE — Telephone Encounter (Signed)
Please advise when pt may return to work.

## 2024-05-17 NOTE — Telephone Encounter (Signed)
 Needing to know when his return to work date will be.  Please advise.  Call:  [phone]

## 2024-05-19 NOTE — Telephone Encounter (Signed)
 Return to work papers faxed back to [employer].  Patient was informed of this and his return to work date on 09/29 per MD.

## 2024-05-20 ENCOUNTER — Other Ambulatory Visit: Payer: Self-pay

## 2024-05-20 ENCOUNTER — Telehealth: Payer: Self-pay

## 2024-05-20 ENCOUNTER — Telehealth: Payer: Self-pay | Admitting: Family Medicine

## 2024-05-20 DIAGNOSIS — I10 Essential (primary) hypertension: Secondary | ICD-10-CM

## 2024-05-20 NOTE — Telephone Encounter (Signed)
 Patient need med refills up until next appointment in October 2025, had to reschedule due to provider out of office and no other openings  clonazePAM  (KLONOPIN ) 0.5 MG tablet [[id number]]   olmesartan  (BENICAR ) 20 MG tablet [[id number]]   Pharmacy [facility]

## 2024-05-20 NOTE — Telephone Encounter (Signed)
 Pt called to let us  know his incision was still draining but had a work note to return to work on 09/29 pt was advised if incision is still draining about 5 days before the return date  we can f/u with MD pt voiced his understanding

## 2024-05-21 ENCOUNTER — Other Ambulatory Visit: Payer: Self-pay | Admitting: Family Medicine

## 2024-05-21 ENCOUNTER — Other Ambulatory Visit: Payer: Self-pay

## 2024-05-21 DIAGNOSIS — I10 Essential (primary) hypertension: Secondary | ICD-10-CM

## 2024-05-21 MED ORDER — OLMESARTAN MEDOXOMIL 20 MG PO TABS: 20.0000 mg | ORAL_TABLET | Freq: Every day | ORAL | 2 refills | Status: AC

## 2024-05-24 ENCOUNTER — Ambulatory Visit: Admitting: Family Medicine

## 2024-05-24 ENCOUNTER — Telehealth: Payer: Self-pay

## 2024-05-24 DIAGNOSIS — N5089 Other specified disorders of the male genital organs: Secondary | ICD-10-CM

## 2024-05-24 DIAGNOSIS — I861 Scrotal varices: Secondary | ICD-10-CM

## 2024-05-24 MED ORDER — DOXYCYCLINE HYCLATE 100 MG PO CAPS: 100.0000 mg | ORAL_CAPSULE | Freq: Two times a day (BID) | ORAL | 0 refills | Status: DC

## 2024-05-24 NOTE — Telephone Encounter (Signed)
 Return call to pt. Pt state's he is having redness, swelling, and pain at the incision. Verbal from Dr. Sherrilee to give pt Doxycycline  100 mg BID X 7 days. Verbalized understanding

## 2024-05-26 NOTE — Telephone Encounter (Signed)
 Patient called to asked about extending his work leave. Pt is made aware that Dr. Sherrilee release him back to work on 09/29. Patient state's he need MD to complete a fitness for work form. Pt state he will bring form by the office for MD to complete.

## 2024-05-27 ENCOUNTER — Encounter: Payer: Self-pay | Admitting: Urology

## 2024-05-27 NOTE — Telephone Encounter (Signed)
 Patient called into the office today with general questions/concerns regarding drainage. He came into office and has picture of a lot of drainage from surgery site. Also dropped off return to work request. He will upload picture of drainage to his mychart. Patient may be reached at [phone] to discuss questions.

## 2024-05-27 NOTE — Telephone Encounter (Signed)
 Patient reports a light pink / brown drainage coming from his incision site.  He still has 4 days of the doxycyline left and he reports that the redness has improved but the swelling has not improved.  He states the stitches are coming through the incision, he was concerned that they have not dissolved yet.  Patient denies fever at this time.  Patient informed to be checked out at ER or urgent care if he develops a fever.  Please advise on how patient should proceed at this time.

## 2024-05-27 NOTE — Telephone Encounter (Signed)
 Can you please call to inquire.

## 2024-05-28 ENCOUNTER — Other Ambulatory Visit: Payer: Self-pay

## 2024-05-28 DIAGNOSIS — I861 Scrotal varices: Secondary | ICD-10-CM

## 2024-05-28 DIAGNOSIS — N5089 Other specified disorders of the male genital organs: Secondary | ICD-10-CM

## 2024-05-28 MED ORDER — DOXYCYCLINE HYCLATE 100 MG PO CAPS: 100.0000 mg | ORAL_CAPSULE | Freq: Two times a day (BID) | ORAL | 0 refills | Status: AC

## 2024-05-28 NOTE — Telephone Encounter (Signed)
 Spoke with patient via phone, he is aware of MD recommendations.  See telephone encounter for details.

## 2024-05-28 NOTE — Telephone Encounter (Signed)
 Verbal from Dr. Sherrilee patient may return to work on 06/07/2024 and he would like to continue doxycycline  abx for another week.  Work note updated and signed by MD and faxed back to patient employer.  Rx for doxycycline  sent to pharmacy and patient aware.

## 2024-06-08 ENCOUNTER — Ambulatory Visit: Admitting: Nurse Practitioner

## 2024-06-18 ENCOUNTER — Telehealth: Payer: Self-pay | Admitting: Urology

## 2024-06-18 NOTE — Telephone Encounter (Signed)
 Patient called to make sure his short term disability form has been sent to [facility]

## 2024-06-21 NOTE — Telephone Encounter (Signed)
 My chart message sent

## 2024-07-15 ENCOUNTER — Ambulatory Visit: Admitting: Family Medicine

## 2024-07-15 ENCOUNTER — Encounter: Payer: Self-pay | Admitting: Family Medicine

## 2024-07-15 ENCOUNTER — Ambulatory Visit (HOSPITAL_COMMUNITY)
Admission: RE | Admit: 2024-07-15 | Discharge: 2024-07-15 | Disposition: A | Discharge status: Home / Self Care | Source: Ambulatory Visit | Attending: Family Medicine | Admitting: Family Medicine

## 2024-07-15 VITALS — BP 142/90 | HR 80 | Ht 75.0 in | Wt 212.0 lb

## 2024-07-15 DIAGNOSIS — M25552 Pain in left hip: Secondary | ICD-10-CM | POA: Diagnosis not present

## 2024-07-15 DIAGNOSIS — M25562 Pain in left knee: Secondary | ICD-10-CM | POA: Insufficient documentation

## 2024-07-15 DIAGNOSIS — F419 Anxiety disorder, unspecified: Secondary | ICD-10-CM

## 2024-07-15 DIAGNOSIS — Z23 Encounter for immunization: Secondary | ICD-10-CM | POA: Diagnosis not present

## 2024-07-15 MED ORDER — CLONAZEPAM 0.5 MG PO TABS: 0.5000 mg | ORAL_TABLET | Freq: Two times a day (BID) | ORAL | 0 refills | Status: AC | PRN

## 2024-07-15 MED ORDER — OXYCODONE-ACETAMINOPHEN 5-325 MG PO TABS: 1.0000 | ORAL_TABLET | ORAL | 0 refills | Status: AC | PRN

## 2024-07-15 NOTE — Assessment & Plan Note (Addendum)
 The patient is currently taking clonazepam  0.5 mg as needed for anxiety and is requesting a refill of his prescription today. He denies suicidal or homicidal ideation (SI/HI) and auditory or visual hallucinations (AVH). The patient is also requesting a referral to psychiatry for management of his anxiety and depression. Referral has been placed.     07/15/2024    3:48 PM 01/22/2024    9:00 AM 10/22/2023   12:01 PM 02/25/2023    9:52 AM  GAD 7 : Generalized Anxiety Score  Nervous, Anxious, on Edge 2 2 0 0  Control/stop worrying 2 1 0 1  Worry too much - different things 2 2 0 1  Trouble relaxing 3 2 0 1  Restless 2 1 0 1  Easily annoyed or irritable 3 2 0 1  Afraid - awful might happen 1 0 0 0  Total GAD 7 Score 15 10 0 5  Anxiety Difficulty Somewhat difficult Somewhat difficult Not difficult at all Somewhat difficult

## 2024-07-15 NOTE — Progress Notes (Signed)
 Established Patient Office Visit  Subjective:  Patient ID: Kyle Gill, male    DOB: 1981-02-05  Age: 43 y.o. MRN: [id number]  CC:  Chief Complaint  Patient presents with   Anxiety    Follow up, needs refills   Knee Pain    Knee and hip pain     HPI Kyle Gill is a 43 y.o. male with past medical history of HTN, depression, anxiety presents for f/u of  chronic medical conditions.  Left knee pain:Reports left knee pain rated 8/10 at its worst, described as anterior aching pain that worsens with bending, stepping down, or prolonged sitting. No pain when the leg is straight. Denies fever, swelling, or redness. Symptoms have been ongoing for 3-4 months. He has a history of bilateral patellar dislocations but denies current knee pain at this time. Tylenol  and Motrin  provide minimal relief.  Left Hip pain: Reports intermittent aching pain in the left hip that comes and goes without any recent injury or trauma. Pain rated 4/5 today. He describes occasional flare-ups consistent with bone-on-bone discomfort. Range of motion is intact.   Past Medical History:  Diagnosis Date   Bell's palsy    HIV positive (HCC)    Hypertension    Pneumonia     Past Surgical History:  Procedure Laterality Date   COLONOSCOPY WITH PROPOFOL  N/A 03/17/2023   Procedure: COLONOSCOPY WITH PROPOFOL ;  Surgeon: Shaaron Lamar HERO, MD;  Location: [facility];  Service: Endoscopy;  Laterality: N/A;  1:00 pm, asa 2   SHOULDER ARTHROSCOPY WITH LABRAL REPAIR Right 06/13/2016   Procedure: SHOULDER DIAGNOSTIC OPERATIVE ARTHROSCOPY WITH LABRAL REPAIR;  Surgeon: Glendia Cordella Hutchinson, MD;  Location: [facility];  Service: Orthopedics;  Laterality: Right;   VARICOCELECTOMY Left 04/29/2024   Procedure: EXCISION, VARICOCELE;  Surgeon: Sherrilee Belvie CROME, MD;  Location: [facility];  Service: Urology;  Laterality: Left;    Family History  Problem Relation Age of Onset   Colon polyps Father        Numerous   Cancer Sister        Not sure  of cancer type but thinks it involved her intestine   Inflammatory bowel disease Neg Hx     Social History   Socioeconomic History   Marital status: Single    Spouse name: Not on file   Number of children: Not on file   Years of education: Not on file   Highest education level: Some college, no degree  Occupational History   Not on file  Tobacco Use   Smoking status: Every Day    Current packs/day: 0.25    Types: Cigarettes   Smokeless tobacco: Never  Vaping Use   Vaping status: Never Used  Substance and Sexual Activity   Alcohol use: Yes    Comment: occasionally   Drug use: Yes    Types: Marijuana   Sexual activity: Yes    Birth control/protection: None  Other Topics Concern   Not on file  Social History Narrative   Not on file   Social Drivers of Health   Financial Resource Strain: Medium Risk (07/11/2024)   Overall Financial Resource Strain (CARDIA)    Difficulty of Paying Living Expenses: Somewhat hard  Food Insecurity: Food Insecurity Present (07/11/2024)   Hunger Vital Sign    Worried About Running Out of Food in the Last Year: Sometimes true    Ran Out of Food in the Last Year: Sometimes true  Transportation Needs: Unmet Transportation Needs (07/11/2024)   PRAPARE -  Administrator, Civil Service (Medical): No    Lack of Transportation (Non-Medical): Yes  Physical Activity: Sufficiently Active (07/11/2024)   Exercise Vital Sign    Days of Exercise per Week: 5 days    Minutes of Exercise per Session: 150+ min  Stress: Stress Concern Present (07/11/2024)   [employer] of Occupational Health - Occupational Stress Questionnaire    Feeling of Stress: Rather much  Social Connections: Moderately Integrated (07/11/2024)   Social Connection and Isolation Panel    Frequency of Communication with Friends and Family: Three times a week    Frequency of Social Gatherings with Friends and Family: Once a week    Attends Religious Services: More than 4 times  per year    Active Member of [employer] or Organizations: No    Attends Engineer, Structural: Not on file    Marital Status: Living with partner  Intimate Partner Violence: Not on file    Outpatient Medications Prior to Visit  Medication Sig Dispense Refill   cetirizine (ZYRTEC) 10 MG tablet Take 10 mg by mouth daily.     doxycycline  (VIBRAMYCIN ) 100 MG capsule Take 1 capsule (100 mg total) by mouth every 12 (twelve) hours. 14 capsule 0   olmesartan  (BENICAR ) 20 MG tablet Take 1 tablet (20 mg total) by mouth daily. 90 tablet 2   ondansetron  (ZOFRAN ) 4 MG tablet Take 1 tablet (4 mg total) by mouth every 8 (eight) hours as needed for nausea or vomiting. 20 tablet 0   Polyethyl Glycol-Propyl Glycol (SYSTANE OP) Place 1 drop into both eyes as needed (dry eyes).     tadalafil  (CIALIS ) 5 MG tablet Take 1 tablet (5 mg total) by mouth daily as needed for erectile dysfunction. 30 tablet 0   clonazePAM  (KLONOPIN ) 0.5 MG tablet Take 1 tablet (0.5 mg total) by mouth 2 (two) times daily as needed for anxiety. (Patient not taking: Reported on 04/21/2024) 20 tablet 0   oxyCODONE -acetaminophen  (PERCOCET) 5-325 MG tablet Take 1 tablet by mouth every 4 (four) hours as needed for severe pain (pain score 7-10). 30 tablet 0   No facility-administered medications prior to visit.    Allergies  Allergen Reactions   Aspirin Itching and Other (See Comments)    Dry mouth   Penicillins Itching and Other (See Comments)    Inflamed skin around genitalia   Sulfamethoxazole-Trimethoprim Itching and Other (See Comments)    Inflamed skin around genitalia   Chlorhexidine  Hives    ROS Review of Systems  Constitutional:  Negative for fatigue and fever.  Eyes:  Negative for visual disturbance.  Respiratory:  Negative for chest tightness and shortness of breath.   Cardiovascular:  Negative for chest pain and palpitations.  Musculoskeletal:        Left hip and knee pain  Neurological:  Negative for dizziness and  headaches.      Objective:    Physical Exam HENT:     Head: Normocephalic.     Right Ear: External ear normal.     Left Ear: External ear normal.     Nose: No congestion or rhinorrhea.     Mouth/Throat:     Mouth: Mucous membranes are moist.  Cardiovascular:     Rate and Rhythm: Regular rhythm.     Heart sounds: No murmur heard. Pulmonary:     Effort: No respiratory distress.     Breath sounds: Normal breath sounds.  Neurological:     Mental Status: He is alert.  BP (!) 142/90   Pulse 80   Ht 6' 3 (1.905 m)   Wt 212 lb (96.2 kg)   SpO2 99%   BMI 26.50 kg/m  Wt Readings from Last 3 Encounters:  07/15/24 212 lb (96.2 kg)  05/08/24 212 lb (96.2 kg)  04/29/24 210 lb (95.3 kg)    Lab Results  Component Value Date   TSH CANCELED 12/05/2021   Lab Results  Component Value Date   WBC 4.4 02/25/2023   HGB 18.4 (H) 02/25/2023   HCT 53.7 (H) 02/25/2023   MCV 91 02/25/2023   PLT 243 02/25/2023   Lab Results  Component Value Date   NA 137 11/05/2021   K 4.0 11/05/2021   CO2 25 11/05/2021   GLUCOSE 88 11/05/2021   BUN 7 11/05/2021   CREATININE 0.99 11/05/2021   BILITOT 0.8 06/05/2016   ALKPHOS 79 06/05/2016   AST 35 06/05/2016   ALT 34 06/05/2016   PROT 7.5 06/05/2016   ALBUMIN 4.1 06/05/2016   CALCIUM 9.1 11/05/2021   ANIONGAP 8 11/05/2021   Lab Results  Component Value Date   CHOL 172 12/05/2021   Lab Results  Component Value Date   HDL 40 12/05/2021   Lab Results  Component Value Date   LDLCALC 95 12/05/2021   Lab Results  Component Value Date   TRIG 214 (H) 12/05/2021   Lab Results  Component Value Date   CHOLHDL 4.3 12/05/2021   Lab Results  Component Value Date   HGBA1C 5.9 (H) 12/05/2021      Assessment & Plan:  Hip pain, acute, left Assessment & Plan: Encouraged the patient to stop by [facility] to obtain X-rays of the left hip and left knee for further evaluation of symptoms.  Percocet has been prescribed and  should be taken as needed for pain rated greater than 7/10.  Advised the patient to monitor for any worsening pain, swelling, redness, warmth, fever, difficulty bearing weight, numbness, or weakness, and to report these symptoms promptly.   Orders: -     DG HIP UNILAT W OR W/O PELVIS 2-3 VIEWS LEFT -     oxyCODONE -Acetaminophen ; Take 1 tablet by mouth every 4 (four) hours as needed for severe pain (pain score 7-10).  Dispense: 15 tablet; Refill: 0  Left anterior knee pain Assessment & Plan:  Encouraged the patient to stop by [facility] to obtain X-rays of the left hip and left knee for further evaluation of symptoms.  Percocet has been prescribed and should be taken as needed for pain rated greater than 7/10.  Advised the patient to monitor for any worsening pain, swelling, redness, warmth, fever, difficulty bearing weight, numbness, or weakness, and to report these symptoms promptly.      Orders: -     DG Knee Complete 4 Views Left -     oxyCODONE -Acetaminophen ; Take 1 tablet by mouth every 4 (four) hours as needed for severe pain (pain score 7-10).  Dispense: 15 tablet; Refill: 0  Anxiety Assessment & Plan: The patient is currently taking clonazepam  0.5 mg as needed for anxiety and is requesting a refill of his prescription today. He denies suicidal or homicidal ideation (SI/HI) and auditory or visual hallucinations (AVH). The patient is also requesting a referral to psychiatry for management of his anxiety and depression. Referral has been placed.     07/15/2024    3:48 PM 01/22/2024    9:00 AM 10/22/2023   12:01 PM 02/25/2023    9:52  AM  GAD 7 : Generalized Anxiety Score  Nervous, Anxious, on Edge 2 2 0 0  Control/stop worrying 2 1 0 1  Worry too much - different things 2 2 0 1  Trouble relaxing 3 2 0 1  Restless 2 1 0 1  Easily annoyed or irritable 3 2 0 1  Afraid - awful might happen 1 0 0 0  Total GAD 7 Score 15 10 0 5  Anxiety Difficulty Somewhat difficult  Somewhat difficult Not difficult at all Somewhat difficult        Orders: -     Ambulatory referral to Psychiatry -     clonazePAM ; Take 1 tablet (0.5 mg total) by mouth 2 (two) times daily as needed for anxiety.  Dispense: 15 tablet; Refill: 0  Encounter for immunization Assessment & Plan: Patient educated on CDC recommendation for the vaccine. Verbal consent was obtained from the patient, vaccine administered by nurse, no sign of adverse reactions noted at this time. Patient education on arm soreness and use of tylenol  or ibuprofen  for this patient  was discussed. Patient educated on the signs and symptoms of adverse effect and advise to contact the office if they occur.   Orders: -     Flu vaccine HIGH DOSE PF(Fluzone Trivalent)  Note: This chart has been completed using Engineer, Civil (consulting) software, and while attempts have been made to ensure accuracy, certain words and phrases may not be transcribed as intended.    Follow-up: No follow-ups on file.   Euan Wandler  Z Bacchus, FNP

## 2024-07-15 NOTE — Assessment & Plan Note (Signed)
 Encouraged the patient to stop by [facility] to obtain X-rays of the left hip and left knee for further evaluation of symptoms.  Percocet has been prescribed and should be taken as needed for pain rated greater than 7/10.  Advised the patient to monitor for any worsening pain, swelling, redness, warmth, fever, difficulty bearing weight, numbness, or weakness, and to report these symptoms promptly.

## 2024-07-15 NOTE — Assessment & Plan Note (Signed)
 Patient educated on CDC recommendation for the vaccine. Verbal consent was obtained from the patient, vaccine administered by nurse, no sign of adverse reactions noted at this time. Patient education on arm soreness and use of tylenol or ibuprofen for this patient  was discussed. Patient educated on the signs and symptoms of adverse effect and advise to contact the office if they occur.

## 2024-07-15 NOTE — Patient Instructions (Addendum)
 I appreciate the opportunity to provide care to you today!    Follow up:  6 months  -Please stop by [facility] to obtain X-rays of the left hip and left knee for further evaluation of your symptoms. -Your pain medication has been refilled and should be taken as needed for pain greater than 7/10. -Please monitor for worsening pain, swelling, redness, warmth, fever, difficulty bearing weight, numbness, or weakness, and report these symptoms promptly.   Please follow up if your symptoms worsen or fail to improve.   Referrals today- Psychiatry     Please continue to a heart-healthy diet and increase your physical activities. Try to exercise for at least five days a week.    It was a pleasure to see you and I look forward to continuing to work together on your health and well-being. Please do not hesitate to call the office if you need care or have questions about your care.  In case of emergency, please visit the Emergency Department for urgent care, or contact our clinic at [phone] to schedule an appointment. We're here to help you!   Have a wonderful day and week. With Gratitude, Meade JENEANE Gerlach MSN, FNP-BC, PMHNP-BC'

## 2024-07-16 ENCOUNTER — Other Ambulatory Visit (HOSPITAL_COMMUNITY): Payer: Self-pay

## 2024-07-16 ENCOUNTER — Telehealth: Payer: Self-pay | Admitting: Pharmacy Technician

## 2024-07-16 ENCOUNTER — Telehealth: Payer: Self-pay

## 2024-07-16 NOTE — Telephone Encounter (Signed)
 Copied from CRM [id number]. Topic: Clinical - Medication Prior Auth >> Jul 15, 2024  4:56 PM Tobias CROME wrote: Reason for CRM: Patient informed by pharmacy a PA is needed for his oxyCODONE -acetaminophen  (PERCOCET) 5-325 MG tablet [[id number]].   Patient does not have any pain medication at all.

## 2024-07-16 NOTE — Telephone Encounter (Signed)
 Pharmacy Patient Advocate Encounter   Received notification from Pt Calls Messages that prior authorization for oxyCODONE -Acetaminophen  5-325MG  tablets is required/requested.   Insurance verification completed.   The patient is insured through [facility].   Per test claim: PA required; PA submitted to above mentioned insurance via Latent Key/confirmation #/EOC [id number] Status is pending

## 2024-07-16 NOTE — Telephone Encounter (Signed)
 PA request has been Started. New Encounter has been or will be created for follow up. For additional info see Pharmacy Prior Auth telephone encounter from 07/16/2024.

## 2024-07-16 NOTE — Telephone Encounter (Signed)
 Pharmacy Patient Advocate Encounter  Received notification from [facility] that Prior Authorization for oxyCODONE -Acetaminophen  5-325MG  tablets has been APPROVED from 07/16/2024 to 01/12/2025.   PA #/Case ID/Reference #: [id number]

## 2024-07-19 ENCOUNTER — Ambulatory Visit: Payer: Self-pay | Admitting: Family Medicine

## 2024-07-21 ENCOUNTER — Ambulatory Visit: Admitting: Urology

## 2024-07-21 DIAGNOSIS — I861 Scrotal varices: Secondary | ICD-10-CM

## 2025-02-21 ENCOUNTER — Ambulatory Visit (HOSPITAL_COMMUNITY): Admitting: Registered Nurse
